# Patient Record
Sex: Female | Born: 1960
Health system: Southern US, Community
[De-identification: ages and names within clinical notes are randomized; demographics above are authoritative.]

## PROBLEM LIST (undated history)

## (undated) DIAGNOSIS — E739 Lactose intolerance, unspecified: Secondary | ICD-10-CM

## (undated) DIAGNOSIS — M5430 Sciatica, unspecified side: Secondary | ICD-10-CM

## (undated) DIAGNOSIS — J45909 Unspecified asthma, uncomplicated: Secondary | ICD-10-CM

## (undated) DIAGNOSIS — B009 Herpesviral infection, unspecified: Secondary | ICD-10-CM

## (undated) DIAGNOSIS — M549 Dorsalgia, unspecified: Secondary | ICD-10-CM

## (undated) DIAGNOSIS — M2550 Pain in unspecified joint: Secondary | ICD-10-CM

## (undated) HISTORY — DX: Lactose intolerance, unspecified: E73.9

## (undated) HISTORY — DX: Herpesviral infection, unspecified: B00.9

## (undated) HISTORY — DX: Pain in unspecified joint: M25.50

## (undated) HISTORY — DX: Sciatica, unspecified side: M54.30

## (undated) HISTORY — DX: Dorsalgia, unspecified: M54.9

## (undated) HISTORY — DX: Unspecified asthma, uncomplicated: J45.909

---

## 1996-09-27 HISTORY — PX: MYOMECTOMY: SHX85

## 1997-12-03 HISTORY — PX: TOTAL ABDOMINAL HYSTERECTOMY W/ BILATERAL SALPINGOOPHORECTOMY: SHX83

## 1999-10-26 ENCOUNTER — Other Ambulatory Visit: Admission: RE | Admit: 1999-10-26 | Discharge: 1999-10-26 | Payer: Self-pay | Admitting: Obstetrics and Gynecology

## 2003-08-15 ENCOUNTER — Other Ambulatory Visit: Admission: RE | Admit: 2003-08-15 | Discharge: 2003-08-15 | Payer: Self-pay | Admitting: Obstetrics and Gynecology

## 2005-02-03 ENCOUNTER — Ambulatory Visit (HOSPITAL_COMMUNITY): Admission: RE | Admit: 2005-02-03 | Discharge: 2005-02-03 | Payer: Self-pay | Admitting: Obstetrics and Gynecology

## 2005-09-16 ENCOUNTER — Emergency Department (HOSPITAL_COMMUNITY): Admission: EM | Admit: 2005-09-16 | Discharge: 2005-09-16 | Payer: Self-pay | Admitting: Emergency Medicine

## 2006-04-01 ENCOUNTER — Ambulatory Visit (HOSPITAL_COMMUNITY): Admission: RE | Admit: 2006-04-01 | Discharge: 2006-04-01 | Payer: Self-pay | Admitting: Obstetrics & Gynecology

## 2007-05-22 ENCOUNTER — Ambulatory Visit (HOSPITAL_COMMUNITY): Admission: RE | Admit: 2007-05-22 | Discharge: 2007-05-22 | Payer: Self-pay | Admitting: Obstetrics & Gynecology

## 2007-05-24 ENCOUNTER — Encounter: Admission: RE | Admit: 2007-05-24 | Discharge: 2007-05-24 | Payer: Self-pay | Admitting: Obstetrics & Gynecology

## 2008-09-09 ENCOUNTER — Ambulatory Visit (HOSPITAL_COMMUNITY): Admission: RE | Admit: 2008-09-09 | Discharge: 2008-09-09 | Payer: Self-pay | Admitting: Internal Medicine

## 2008-09-30 ENCOUNTER — Encounter: Admission: RE | Admit: 2008-09-30 | Discharge: 2008-09-30 | Payer: Self-pay | Admitting: Internal Medicine

## 2008-10-16 ENCOUNTER — Encounter: Admission: RE | Admit: 2008-10-16 | Discharge: 2009-01-14 | Payer: Self-pay | Admitting: Anesthesiology

## 2008-12-02 ENCOUNTER — Encounter: Admission: RE | Admit: 2008-12-02 | Discharge: 2009-01-29 | Payer: Self-pay | Admitting: Internal Medicine

## 2009-06-20 ENCOUNTER — Ambulatory Visit (HOSPITAL_COMMUNITY): Admission: RE | Admit: 2009-06-20 | Discharge: 2009-06-20 | Payer: Self-pay | Admitting: Obstetrics & Gynecology

## 2010-07-30 ENCOUNTER — Ambulatory Visit (HOSPITAL_COMMUNITY): Admission: RE | Admit: 2010-07-30 | Discharge: 2010-07-30 | Payer: Self-pay | Admitting: Obstetrics & Gynecology

## 2010-10-18 ENCOUNTER — Encounter: Payer: Self-pay | Admitting: Obstetrics and Gynecology

## 2010-10-18 ENCOUNTER — Encounter: Payer: Self-pay | Admitting: Obstetrics & Gynecology

## 2011-07-05 ENCOUNTER — Emergency Department (HOSPITAL_COMMUNITY)
Admission: EM | Admit: 2011-07-05 | Discharge: 2011-07-05 | Disposition: A | Discharge status: Home / Self Care | Payer: PRIVATE HEALTH INSURANCE | Attending: Emergency Medicine | Admitting: Emergency Medicine

## 2011-07-05 DIAGNOSIS — Y990 Civilian activity done for income or pay: Secondary | ICD-10-CM | POA: Insufficient documentation

## 2011-07-05 DIAGNOSIS — W230XXA Caught, crushed, jammed, or pinched between moving objects, initial encounter: Secondary | ICD-10-CM | POA: Insufficient documentation

## 2011-07-05 DIAGNOSIS — M79609 Pain in unspecified limb: Secondary | ICD-10-CM | POA: Insufficient documentation

## 2011-07-05 DIAGNOSIS — S6000XA Contusion of unspecified finger without damage to nail, initial encounter: Secondary | ICD-10-CM | POA: Insufficient documentation

## 2011-07-07 ENCOUNTER — Ambulatory Visit: Payer: Self-pay

## 2011-07-07 ENCOUNTER — Other Ambulatory Visit: Payer: Self-pay | Admitting: Occupational Medicine

## 2011-07-07 DIAGNOSIS — R52 Pain, unspecified: Secondary | ICD-10-CM

## 2011-11-23 ENCOUNTER — Other Ambulatory Visit: Payer: Self-pay | Admitting: Nurse Practitioner

## 2011-11-23 DIAGNOSIS — Z1231 Encounter for screening mammogram for malignant neoplasm of breast: Secondary | ICD-10-CM

## 2011-12-16 ENCOUNTER — Ambulatory Visit (HOSPITAL_COMMUNITY): Payer: Self-pay

## 2012-07-24 ENCOUNTER — Other Ambulatory Visit: Payer: Self-pay | Admitting: Nurse Practitioner

## 2012-07-24 DIAGNOSIS — Z1231 Encounter for screening mammogram for malignant neoplasm of breast: Secondary | ICD-10-CM

## 2012-07-25 ENCOUNTER — Ambulatory Visit (HOSPITAL_COMMUNITY)
Admission: RE | Admit: 2012-07-25 | Discharge: 2012-07-25 | Disposition: A | Discharge status: Home / Self Care | Payer: 59 | Source: Ambulatory Visit | Attending: Nurse Practitioner | Admitting: Nurse Practitioner

## 2012-07-25 DIAGNOSIS — Z1231 Encounter for screening mammogram for malignant neoplasm of breast: Secondary | ICD-10-CM | POA: Insufficient documentation

## 2012-10-03 ENCOUNTER — Ambulatory Visit (INDEPENDENT_AMBULATORY_CARE_PROVIDER_SITE_OTHER): Payer: Self-pay | Admitting: Family Medicine

## 2012-10-03 DIAGNOSIS — Z713 Dietary counseling and surveillance: Secondary | ICD-10-CM

## 2012-10-14 ENCOUNTER — Emergency Department (HOSPITAL_COMMUNITY)
Admission: EM | Admit: 2012-10-14 | Discharge: 2012-10-14 | Disposition: A | Discharge status: Home / Self Care | Payer: 59 | Source: Home / Self Care

## 2012-10-14 ENCOUNTER — Encounter (HOSPITAL_COMMUNITY): Payer: Self-pay | Admitting: Emergency Medicine

## 2012-10-14 DIAGNOSIS — H6123 Impacted cerumen, bilateral: Secondary | ICD-10-CM

## 2012-10-14 DIAGNOSIS — H6120 Impacted cerumen, unspecified ear: Secondary | ICD-10-CM

## 2012-10-14 MED ORDER — HYDROCODONE-ACETAMINOPHEN 5-325 MG PO TABS: 1.0000 | ORAL_TABLET | Freq: Once | ORAL | Status: DC

## 2012-10-14 MED ORDER — ANTIPYRINE-BENZOCAINE 5.4-1.4 % OT SOLN: 3.0000 [drp] | Freq: Once | OTIC | Status: DC

## 2012-10-14 NOTE — ED Provider Notes (Signed)
History     CSN: [id number]  Arrival date & time 10/14/12  1124   None     Chief Complaint  Patient presents with  . Cerumen Impaction     Patient is a 52 y.o. female presenting with plugged ear sensation. The history is provided by the patient.  Ear Fullness This is a recurrent problem. The current episode started more than 1 week ago. The problem occurs constantly. The problem has been gradually worsening. Nothing aggravates the symptoms. Nothing relieves the symptoms.  Pt reports approx 2 week h/o increasing fullness and pressure in bil ears. Reports hearing is somewhat "muffeled". She has had "build up'' of ear wax in the past and feels this is source of sx's. She has attempted OTC remedies w/o relief. States now having som itching in bil ears which she attributes to use of OTC remedies. Denies fever, URI sx's, or drainage from either ear.   History reviewed. No pertinent past medical history.  Past Surgical History  Procedure Date  . Abdominal hysterectomy 1999    No family history on file.  History  Substance Use Topics  . Smoking status: Never Smoker   . Smokeless tobacco: Not on file  . Alcohol Use: Yes    OB History    Grav Para Term Preterm Abortions TAB SAB Ect Mult Living                  Review of Systems  Constitutional: Negative.   HENT: Positive for hearing loss. Negative for ear pain, congestion and ear discharge.   Eyes: Negative.   Respiratory: Negative.   Cardiovascular: Negative.   Gastrointestinal: Negative.   Genitourinary: Negative.   Musculoskeletal: Negative.   Neurological: Negative.   Hematological: Negative.   Psychiatric/Behavioral: Negative.   All other systems reviewed and are negative.    Allergies  Review of patient's allergies indicates no known allergies.  Home Medications  No current outpatient prescriptions on file.  BP 141/85  Pulse 51  Temp 97.9 F (36.6 C) (Oral)  Resp 18  SpO2 100%  Physical Exam    Constitutional: She is oriented to person, place, and time. She appears well-developed and well-nourished.  HENT:  Head: Normocephalic and atraumatic.  Right Ear: External ear normal. Decreased hearing is noted.  Left Ear: External ear normal. Decreased hearing is noted.  Nose: Nose normal.  Mouth/Throat: Uvula is midline, oropharynx is clear and moist and mucous membranes are normal.       Bil cerumen impactions  Eyes: Conjunctivae normal are normal.  Neck: Neck supple.  Cardiovascular: Normal rate.   Pulmonary/Chest: Effort normal.  Musculoskeletal: Normal range of motion.  Neurological: She is alert and oriented to person, place, and time.  Skin: Skin is warm and dry.  Psychiatric: She has a normal mood and affect.    ED Course  Procedures  Labs Reviewed - No data to display No results found.   1. Impacted cerumen of both ears       MDM  Sx's and PE c/w bil cerumen impactions. TM's normal after bil ear irrigations and successful removal of cerumen. Strategies for avoiding cerumen impactions in future discussed.        Leanne Chang, NP 10/16/12 0157

## 2012-10-14 NOTE — ED Notes (Signed)
Pt c/o bilateral ear irritation x3 weeks Sx include: itching Hx of Cerumen Impaction Denies: fevers, vomiting, nauseas, diarrhea, dizziness Has been using Debrox w/little relief  She is alert w/no signs of acute distress.

## 2012-10-16 NOTE — ED Provider Notes (Signed)
Medical screening examination/treatment/procedure(s) were performed by resident physician or non-physician practitioner and as supervising physician I was immediately available for consultation/collaboration.   KINDL,JAMES DOUGLAS MD.    James D Kindl, MD 10/16/12 1836 

## 2012-12-18 ENCOUNTER — Encounter: Payer: Self-pay | Admitting: Nurse Practitioner

## 2012-12-19 ENCOUNTER — Ambulatory Visit (INDEPENDENT_AMBULATORY_CARE_PROVIDER_SITE_OTHER): Payer: 59 | Admitting: Nurse Practitioner

## 2012-12-19 ENCOUNTER — Encounter: Payer: Self-pay | Admitting: Nurse Practitioner

## 2012-12-19 VITALS — BP 110/70 | Ht 66.0 in | Wt 262.0 lb

## 2012-12-19 DIAGNOSIS — K625 Hemorrhage of anus and rectum: Secondary | ICD-10-CM

## 2012-12-19 DIAGNOSIS — Z01419 Encounter for gynecological examination (general) (routine) without abnormal findings: Secondary | ICD-10-CM

## 2012-12-19 DIAGNOSIS — B009 Herpesviral infection, unspecified: Secondary | ICD-10-CM | POA: Insufficient documentation

## 2012-12-19 DIAGNOSIS — N3090 Cystitis, unspecified without hematuria: Secondary | ICD-10-CM

## 2012-12-19 LAB — HEMOGLOBIN, FINGERSTICK: Hemoglobin, fingerstick: 14.1 g/dL (ref 12.0–16.0)

## 2012-12-19 MED ORDER — VALACYCLOVIR HCL 1 G PO TABS: 1000.0000 mg | ORAL_TABLET | Freq: Two times a day (BID) | ORAL | Status: DC

## 2012-12-19 NOTE — Procedures (Signed)
Subjective:    Hannah Gibson is a 52 y.o. female here for evaluation of blood in stool. Patient has associated symptoms of constipation and visible blood: just notes blood on TP. The patient denies abdominal pain, alternating loose stools and constipation, change in stool color: pain with normal stools and diarrhea. The patient has a known history of: no known risk factors. The patient has had 3 episodes of rectal bleeding. There is not a history of rectal injury. Patient has not had similar episodes of rectal bleeding in the past.  The following portions of the patient's history were reviewed and updated as appropriate: allergies, current medications, past family history, past medical history, past social history, past surgical history and problem list.  Review of Systems Constitutional: negative Respiratory: negative Cardiovascular: negative Gastrointestinal: negative Genitourinary:positive for dysuria and frequency Hematologic/lymphatic: negative Behavioral/Psych: negative    Objective:    BP 110/70  Ht 5\' 6"  (1.676 m)  Wt 262 lb (118.842 kg)  BMI 42.31 kg/m2  LMP 12/09/1997  General Appearance:  Alert, cooperative, no distress, appears stated age  Head:  Normocephalic, without obvious abnormality, atraumatic  Eyes:  PERRL,            Neck: Supple, symmetrical, trachea midline, no adenopathy;  thyroid: not enlarged, symmetric, no tenderness/mass/nodules; no carotid bruit or JVD  Back:   Symmetric, no curvature, ROM normal, no CVA tenderness  Lungs:   Clear to auscultation bilaterally, respirations unlabored  Breasts:  No masses or tenderness  Heart:  Regular rate and rhythm, S1 and S2 normal, no murmur, rub, or gallop  Abdomen:   Soft, non-tender, bowel sounds active all four quadrants,  no masses, no organomegaly  Pelvic: As  Noted on AEX  Extremities: Extremities normal, atraumatic, no cyanosis or edema  Pulses: 2+ and symmetric  Skin: Skin color, texture, turgor  normal, no rashes or lesions  Lymph nodes: Cervical, supraclavicular, and axillary nodes normal  Neurologic: Normal    Rectal: No external mass or hemorrhoids, no sign of bleeding.  Digital exam with internal hemorrhoid at 7 '00 clock position.  On visual exam with anal scope pt. had a small 4 mm rectal internal hemorrhoid without bleeding. No other masses found.  Patient tolerated the procedure well.   Assessment:    Rectal bleeding, likely secondary to internal hemorrhoids.    Plan:    1. If continues to call back and will get GI evaluation  Follow up as needed.  She is given After visit summary about hemorrhoids

## 2012-12-19 NOTE — Progress Notes (Signed)
52 y.o. Single African American female   G2P0020 here for annual exam.    Currently no vaso symptoms.  She is not currently sexually active and has no vaginal symptoms. Last week had urinary pressure, no dysuria, and some frequency.  Has tried to increase po fluids and currently feels better.  About 3 wks ago had BRRB with straining to have a BM.  Blood was on the stool but not in it.  She was straining a lot during this episode.  Since then has noted this 2 other times with blood on the tissue. No associated abdominal pain, weakness or fatigue.  Normal BM's since without bleeding.  No past history of colon disease.  No change in appetite, abdominal cramps.  Symptoms only present when straining. Not sexually active.  Patient's last menstrual period was 12/09/1997.          Sexually active: no  The current method of family planning is post hysterectomy.    Exercising: no  no exercise Last mammogram:06/2012 Last pap: 2001 Last BMD: none Alcohol: no Tobacco: no   No health maintenance topics applied.  Family History  Problem Relation Age of Onset  . Diabetes Mother   . Hypertension Mother   . Kidney disease Mother   . Diabetes Maternal Aunt     There is no problem list on file for this patient.   Past Medical History  Diagnosis Date  . HSV infection     Past Surgical History  Procedure Laterality Date  . Myomectomy  1998  . Total abdominal hysterectomy w/ bilateral salpingoophorectomy  12/03/1997    secondary to fibroids Patient states she has one ovary 12/19/12 BMP, CMA    Allergies: Review of patient's allergies indicates no known allergies.  Current Outpatient Prescriptions  Medication Sig Dispense Refill  . Multiple Vitamin (MULTIVITAMIN WITH MINERALS) TABS Take 1 tablet by mouth daily.      . naproxen sodium (ANAPROX) 220 MG tablet Take 220 mg by mouth 2 (two) times daily with a meal.      . valACYclovir (VALTREX) 1000 MG tablet Take 1,000 mg by mouth 2 (two) times  daily.       No current facility-administered medications for this visit.    ROS: Pertinent items are noted in HPI.  Exam:    BP 110/70  Ht 5\' 6"  (1.676 m)  Wt 262 lb (118.842 kg)  BMI 42.31 kg/m2  LMP 12/09/1997 Weight change: @WEIGHTCHANGE @ Last 3 height recordings:  Ht Readings from Last 3 Encounters:  12/19/12 5\' 6"  (1.676 m)   General appearance: alert, cooperative, no distress and moderately obese Head: Normocephalic, without obvious abnormality, atraumatic Neck: no adenopathy, no carotid bruit, no JVD, supple, symmetrical, trachea midline and thyroid not enlarged, symmetric, no tenderness/mass/nodules Lungs: clear to auscultation bilaterally Breasts: normal appearance, no masses or tenderness Heart: regular rate and rhythm, S1, S2 normal, no murmur, click, rub or gallop Abdomen: soft, non-tender; bowel sounds normal; no masses,  no organomegaly Extremities: extremities normal, atraumatic, no cyanosis or edema Skin: Skin color, texture, turgor normal. No rashes or lesions Lymph nodes: Cervical, supraclavicular, and axillary nodes normal. no inguinal nodes palpated Neurologic: Grossly normal   Pelvic: External genitalia:  no lesions              Urethra: not indicated and normal appearing urethra with no masses, tenderness or lesions              Bartholins and Skenes: normal  Vagina: normal appearing vagina with normal color and discharge, no lesions              Cervix: absent              Pap taken: no        Bimanual Exam:  Uterus:  uterus is surgically absent, vaginal cuff well healed                                      Adnexa:    normal adnexa in size, nontender and no masses                                      Rectovaginal: no external hemorrhoids or evidence of bleeding                                      Anus:  As noted in procedure note, there is an internal rectal hemorrhoid at 7:00 position that is not bleeding   Urine chemstrip trace  protein, culture and micro sent to lab  Hgb : 14.1  A:   GYN well woman exam  R/O UTI  BRRB - from internal hemorrhoid as noted on proctoscope    Plan: Plan:     1. Medications: not indicated at this time  2. Maintain adequate hydration  3. Follow up if symptoms not improving, and prn if further rectal bleeding  4.  Mammogram due 06/2012  5. Return annually or prn      An After Visit Summary was printed and given to the patient.

## 2012-12-19 NOTE — Patient Instructions (Addendum)
If any further rectal bleeding call us back.   Rectal Bleeding Rectal bleeding is when blood passes out of the anus. It is usually a sign that something is wrong. It may not be serious, but it should always be evaluated. Rectal bleeding may present as bright red blood or extremely dark stools. The color may range from dark red or maroon to black (like tar). It is important that the cause of rectal bleeding be identified so treatment can be started and the problem corrected. CAUSES   Hemorrhoids. These are enlarged (dilated) blood vessels or veins in the anal or rectal area.  Fistulas. Theseare abnormal, burrowing channels that usually run from inside the rectum to the skin around the anus. They can bleed.  Anal fissures. This is a tear in the tissue of the anus. Bleeding occurs with bowel movements.  Diverticulosis. This is a condition in which pockets or sacs project from the bowel wall. Occasionally, the sacs can bleed.  Diverticulitis. Thisis an infection involving diverticulosis of the colon.  Proctitis and colitis. These are conditions in which the rectum, colon, or both, can become inflamed and pitted (ulcerated).  Polyps and cancer. Polyps are non-cancerous (benign) growths in the colon that may bleed. Certain types of polyps turn into cancer.  Protrusion of the rectum. Part of the rectum can project from the anus and bleed.  Certain medicines.  Intestinal infections.  Blood vessel abnormalities. HOME CARE INSTRUCTIONS  Eat a high-fiber diet to keep your stool soft.  Limit activity.  Drink enough fluids to keep your urine clear or pale yellow.  Warm baths may be useful to soothe rectal pain.  Follow up with your caregiver as directed. SEEK IMMEDIATE MEDICAL CARE IF:  You develop increased bleeding.  You have black or dark red stools.  You vomit blood or material that looks like coffee grounds.  You have abdominal pain or tenderness.  You have a fever.  You  feel weak, nauseous, or you faint.  You have severe rectal pain or you are unable to have a bowel movement. MAKE SURE YOU:  Understand these instructions.  Will watch your condition.  Will get help right away if you are not doing well or get worse. Document Released: 03/05/2002 Document Revised: 12/06/2011 Document Reviewed: 02/28/2011 [facility] Patient Information 2013 [place], [place].

## 2012-12-20 LAB — URINALYSIS, MICROSCOPIC ONLY
Bacteria, UA: NONE SEEN
Casts: NONE SEEN
Crystals: NONE SEEN

## 2012-12-21 LAB — CULTURE, URINE COMPREHENSIVE
Colony Count: NO GROWTH
Organism ID, Bacteria: NO GROWTH

## 2012-12-22 LAB — POCT URINALYSIS DIPSTICK
Bilirubin, UA: NEGATIVE
Blood, UA: NEGATIVE
Glucose, UA: NEGATIVE
Ketones, UA: NEGATIVE
Leukocytes, UA: NEGATIVE
Nitrite, UA: NEGATIVE

## 2012-12-22 NOTE — Progress Notes (Signed)
Encounter reviewed by Dr. Brook Silva.  

## 2013-01-23 ENCOUNTER — Telehealth: Payer: Self-pay | Admitting: Nurse Practitioner

## 2013-01-23 NOTE — Telephone Encounter (Signed)
Opened in error

## 2013-02-05 ENCOUNTER — Ambulatory Visit (INDEPENDENT_AMBULATORY_CARE_PROVIDER_SITE_OTHER): Payer: 59 | Admitting: Family Medicine

## 2013-02-05 VITALS — BP 132/82 | HR 76 | Temp 98.2°F | Resp 16 | Ht 67.0 in | Wt 264.4 lb

## 2013-02-05 DIAGNOSIS — H9209 Otalgia, unspecified ear: Secondary | ICD-10-CM

## 2013-02-05 DIAGNOSIS — H9202 Otalgia, left ear: Secondary | ICD-10-CM

## 2013-02-05 DIAGNOSIS — H60392 Other infective otitis externa, left ear: Secondary | ICD-10-CM

## 2013-02-05 DIAGNOSIS — H60399 Other infective otitis externa, unspecified ear: Secondary | ICD-10-CM

## 2013-02-05 MED ORDER — OFLOXACIN 0.3 % OT SOLN: 10.0000 [drp] | Freq: Every day | OTIC | Status: DC

## 2013-02-05 MED ORDER — AMOXICILLIN 875 MG PO TABS: 875.0000 mg | ORAL_TABLET | Freq: Two times a day (BID) | ORAL | Status: DC

## 2013-02-05 NOTE — Progress Notes (Signed)
[facility] [address] [place] [address] [phone]  Date:  02/05/2013   Name:  Hannah Gibson   DOB:  [birth year]   MRN:  [id number]  PCP:  Gwen Pounds, MD    Chief Complaint: Otalgia   History of Present Illness:  Hannah Gibson is a 52 y.o. very pleasant female patient who presents with the following:  She is here with a left ear problem.  The area feels sore, and her hearing is decreased.  She has noted this problem for the last 4 or 5 days.  She has not had any external drainage.  Actually her ears have bothered her some over the last few years.  She feels that her ears get clogged very easily and she has to irrigate them about once a week.   She has not been swimming She has not noted a cough, sore throat or fever.    She is otherwise generally healthy.  History of HSV/ valtrex but no other chronic medications.   NKDA, never a smoker.    Patient Active Problem List   Diagnosis Date Noted  . HSV infection 12/19/2012  . Rectal bleeding 12/19/2012    Past Medical History  Diagnosis Date  . HSV infection     Past Surgical History  Procedure Laterality Date  . Myomectomy  1998  . Total abdominal hysterectomy w/ bilateral salpingoophorectomy  12/03/1997    secondary to fibroids    History  Substance Use Topics  . Smoking status: Never Smoker   . Smokeless tobacco: Never Used  . Alcohol Use: 2.5 oz/week    5 drink(s) per week    Family History  Problem Relation Age of Onset  . Diabetes Mother   . Hypertension Mother   . Kidney disease Mother   . Glaucoma Mother   . Diabetes Maternal Aunt   . Glaucoma Sister     No Known Allergies  Medication list has been reviewed and updated.  Current Outpatient Prescriptions on File Prior to Visit  Medication Sig Dispense Refill  . Multiple Vitamin (MULTIVITAMIN WITH MINERALS) TABS Take 1 tablet by mouth daily.      . naproxen sodium (ANAPROX) 220 MG tablet Take 220 mg by mouth 2  (two) times daily with a meal.      . valACYclovir (VALTREX) 1000 MG tablet Take 1 tablet (1,000 mg total) by mouth 2 (two) times daily.  30 tablet  12   No current facility-administered medications on file prior to visit.    Review of Systems:  As per HPI- otherwise negative.   Physical Examination: Filed Vitals:   02/05/13 0844  BP: 158/80  Pulse: 76  Temp: 98.2 F (36.8 C)  Resp: 16   Filed Vitals:   02/05/13 0844  Height: 5\' 7"  (1.702 m)  Weight: 264 lb 6.4 oz (119.931 kg)   Body mass index is 41.4 kg/(m^2). Ideal Body Weight: Weight in (lb) to have BMI = 25: 159.3  GEN: WDWN, NAD, Non-toxic, A & O x 3, obese HEENT: Atraumatic, Normocephalic. Neck supple. No masses, No LAD. Oropharynx normal.  PEERL,EOMI.   She has narrow ear canals bilaterally. The right TM is normal The left ear canal is very narrow, and appears slightly edematous and moist.  Not able to fully visualize the TM.  She is tender with manipulation of the pinna.   Ears and Nose: No external deformity. CV: RRR, No M/G/R. No JVD. No thrill. No extra heart sounds.  PULM: CTA B, no wheezes, crackles, rhonchi. No retractions. No resp. distress. No accessory muscle use. EXTR: No c/c/e NEURO Normal gait.  PSYCH: Normally interactive. Conversant. Not depressed or anxious appearing.  Calm demeanor.    Assessment and Plan: Otitis, externa, infective, left - Plan: ofloxacin (FLOXIN) 0.3 % otic solution, amoxicillin (AMOXIL) 875 MG tablet  Ear pain, left - Plan: Ambulatory referral to ENT  Suspect she has some OE- may be due to her very narrow ear canals and chronic water retention.  Will treat with floxin otic and amoxicillin.  Referral to ENT for more chronic management.  Recheck in one week- Sooner if worse.     Signed Abbe Amsterdam, MD

## 2013-02-05 NOTE — Patient Instructions (Addendum)
Use the ear drops- either 10 drops daily or 5 drops twice a day.  Use the amoxicillin as directed.   We will arrange for you to see and ENT doctor for chronic follow-up of your ear problem.  Please see either your PCP, our clinic or your ENT in about one week for a recheck of your ear- sooner if you do not feel better

## 2013-09-13 ENCOUNTER — Telehealth: Payer: Self-pay

## 2013-09-13 NOTE — Telephone Encounter (Signed)
Last AEX 11-2012 with Patty. LMTCB

## 2013-09-13 NOTE — Telephone Encounter (Signed)
Pt has a yeast infection and can not get away from work to come in . Pt wants to have something called into the pharmacy if possible.

## 2013-09-13 NOTE — Telephone Encounter (Signed)
Patient states she has external yeast infecction and also between skin folds on abdomen and requesting medication be called in because she works till [employer]. Advised she would need OV for treatment and offered urgent care if prefers their extended schedule hours. Patient states she had not thought of that but will go there.   Routing to provider for final review. Patient agreeable to disposition. Will close encounter

## 2014-04-13 ENCOUNTER — Ambulatory Visit (INDEPENDENT_AMBULATORY_CARE_PROVIDER_SITE_OTHER): Payer: 59 | Admitting: Emergency Medicine

## 2014-04-13 VITALS — BP 126/84 | HR 74 | Temp 98.2°F | Resp 16 | Ht 66.5 in | Wt 265.0 lb

## 2014-04-13 DIAGNOSIS — H10219 Acute toxic conjunctivitis, unspecified eye: Secondary | ICD-10-CM

## 2014-04-13 DIAGNOSIS — H10213 Acute toxic conjunctivitis, bilateral: Secondary | ICD-10-CM

## 2014-04-13 NOTE — Progress Notes (Signed)
[facility] [address] [place] [address] [phone]  Date:  04/13/2014   Name:  Hannah Gibson   DOB:  Nov 06, 1960   MRN:  [id number]  PCP:  Precious Reel, MD    Chief Complaint: Eye Problem   History of Present Illness:  Hannah Gibson is a 53 y.o. very pleasant female patient who presents with the following:  Patient got some facial chemical compound in her eyes.  Has marked blepharospasm and burning.  No foreign body sensation.  Burning pain in eyes.  Denies other complaint or health concern today. Denies other complaint or health concern today.   Patient Active Problem List   Diagnosis Date Noted  . HSV infection 12/19/2012  . Rectal bleeding 12/19/2012    Past Medical History  Diagnosis Date  . HSV infection     Past Surgical History  Procedure Laterality Date  . Myomectomy  1998  . Total abdominal hysterectomy w/ bilateral salpingoophorectomy  12/03/1997    secondary to fibroids    History  Substance Use Topics  . Smoking status: Never Smoker   . Smokeless tobacco: Never Used  . Alcohol Use: 2.5 oz/week    5 drink(s) per week    Family History  Problem Relation Age of Onset  . Diabetes Mother   . Hypertension Mother   . Kidney disease Mother   . Glaucoma Mother   . Diabetes Maternal Aunt   . Glaucoma Sister     No Known Allergies  Medication list has been reviewed and updated.  Current Outpatient Prescriptions on File Prior to Visit  Medication Sig Dispense Refill  . amoxicillin (AMOXIL) 875 MG tablet Take 1 tablet (875 mg total) by mouth 2 (two) times daily.  20 tablet  0  . Multiple Vitamin (MULTIVITAMIN WITH MINERALS) TABS Take 1 tablet by mouth daily.      . naproxen sodium (ANAPROX) 220 MG tablet Take 220 mg by mouth 2 (two) times daily with a meal.      . ofloxacin (FLOXIN) 0.3 % otic solution Place 10 drops into the left ear daily.  5 mL  0  . valACYclovir (VALTREX) 1000 MG tablet Take 1 tablet (1,000 mg  total) by mouth 2 (two) times daily.  30 tablet  12   No current facility-administered medications on file prior to visit.    Review of Systems:  As per HPI, otherwise negative.     Physical Examination: Filed Vitals:   04/13/14 0858  BP: 126/84  Pulse: 74  Temp: 98.2 F (36.8 C)  Resp: 16   Filed Vitals:   04/13/14 0858  Height: 5' 6.5" (1.689 m)  Weight: 265 lb (120.203 kg)   Body mass index is 42.14 kg/(m^2). Ideal Body Weight: Weight in (lb) to have BMI = 25: 156.9   GEN: WDWN, moderate distress.  No facial burns, Non-toxic, Alert & Oriented x 3 HEENT: Atraumatic, Normocephalic.  Ears and Nose: No external deformity.  Marked blepharmospasm and conjunctival injection EXTR: No clubbing/cyanosis/edema NEURO: Normal gait.  PSYCH: Normally interactive. Conversant. Not depressed or anxious appearing.  Calm demeanor.    Assessment and Plan: Washed 1 liter each with NSS Resolved pain and opens eyes on command.   No complaints.  Signed,  Ellison Carwin, MD

## 2014-04-13 NOTE — Patient Instructions (Signed)
Chemical Conjunctivitis Chemical conjunctivitis is an irritation of the underside of the eyelid and the white part of the eye. Conjunctivitis can be caused by infection, allergy or chemical irritation. In your case it has been caused by a chemical irritation of the eye. Symptoms almost always include: tearing, light sensitivity, gritty feeling (sensation) in the eyes, swelling of your eyelids, and often severe pain. In spite of the severe pain, this irritation will run its course and will improve within 24 hours.  HOME CARE INSTRUCTIONS   To ease discomfort apply a cool, clean wash cloth to your eye for 10 to 20 minutes, 3 to 4 times per day.  Do not rub your eyes.  Gently wipe away any discharge from the eyes with moistened tissues.  Wash your hands often with soap and use paper towels to dry.  Sunglasses may be helpful if light bothers your eyes.  Do not use eye make-up.  Do not use contact lenses until the irritation is gone.  Do not operate machinery or drive if your vision is blurred.  Take medications as directed by your caregiver. Artificial tears may ease discomfort.  Avoid the chemical or surroundings which caused the problem. Always use eye protection as necessary. SEEK MEDICAL CARE IF:   The eye is still pink (inflamed) 3 days after beginning treatment.  Pain in the eye increases.  You have discharge coming from either eye.  Your eyelids are stuck together in the morning.  You have an increased sensitivity to light.  An oral temperature above 102 F (38.9 C) develops.  You develop facial pain.  You have any problems that may be related to the medicine you are taking. SEEK IMMEDIATE MEDICAL CARE IF:   Your vision is getting worse.  You develop severe eye pain. MAKE SURE YOU:   Understand these instructions.  Will watch your condition.  Will get help right away if you are not doing well or get worse. Document Released: 06/23/2005 Document Revised:  12/06/2011 Document Reviewed: 05/01/2008 [employer] Patient Information 2015 [employer]. This information is not intended to replace advice given to you by your health care provider. Make sure you discuss any questions you have with your health care provider.  

## 2014-04-25 ENCOUNTER — Other Ambulatory Visit (HOSPITAL_COMMUNITY): Payer: Self-pay | Admitting: Family Medicine

## 2014-04-25 DIAGNOSIS — Z1231 Encounter for screening mammogram for malignant neoplasm of breast: Secondary | ICD-10-CM

## 2014-05-14 ENCOUNTER — Ambulatory Visit (HOSPITAL_COMMUNITY)
Admission: RE | Admit: 2014-05-14 | Discharge: 2014-05-14 | Disposition: A | Discharge status: Home / Self Care | Payer: 59 | Source: Ambulatory Visit | Attending: Family Medicine | Admitting: Family Medicine

## 2014-05-14 DIAGNOSIS — Z1231 Encounter for screening mammogram for malignant neoplasm of breast: Secondary | ICD-10-CM | POA: Insufficient documentation

## 2015-05-24 ENCOUNTER — Other Ambulatory Visit: Payer: Self-pay | Admitting: Nurse Practitioner

## 2015-05-26 NOTE — Telephone Encounter (Signed)
Pt not seen in 2 years - needs AEX scheduled before we can refill.

## 2015-05-26 NOTE — Telephone Encounter (Signed)
Patient was given a call and message was left stating that she needs an AEX before refilling her medication.

## 2015-05-26 NOTE — Telephone Encounter (Signed)
Medication refill request: Valtrex Last AEX:  12/19/12 with PG Next AEX: not scheduled Last MMG (if hormonal medication request):  Refill authorized: please advise.

## 2015-05-29 ENCOUNTER — Other Ambulatory Visit: Payer: Self-pay | Admitting: *Deleted

## 2015-05-29 ENCOUNTER — Other Ambulatory Visit: Payer: Self-pay | Admitting: Nurse Practitioner

## 2015-05-29 DIAGNOSIS — Z1231 Encounter for screening mammogram for malignant neoplasm of breast: Secondary | ICD-10-CM

## 2015-05-29 DIAGNOSIS — B009 Herpesviral infection, unspecified: Secondary | ICD-10-CM

## 2015-05-29 MED ORDER — VALACYCLOVIR HCL 1 G PO TABS: 1000.0000 mg | ORAL_TABLET | Freq: Two times a day (BID) | ORAL | Status: DC

## 2015-05-29 NOTE — Telephone Encounter (Signed)
AEX scheduled for 07/23/15 with PG

## 2015-05-29 NOTE — Telephone Encounter (Signed)
Faxed Medication refill request from [facility]: Valacyclovir 1 gram  Last AEX:  12/19/2012 with PG  Next AEX: 07/23/2015 PG  Last MMG (if hormonal medication request): n/a Refill authorized: #30/1 rfs  (routed Ms. Debbie since Ms. Chong Sicilian is out of the office today)

## 2015-05-29 NOTE — Telephone Encounter (Signed)
Needs aex will refill once only

## 2015-06-04 ENCOUNTER — Ambulatory Visit (HOSPITAL_COMMUNITY)
Admission: RE | Admit: 2015-06-04 | Discharge: 2015-06-04 | Disposition: A | Discharge status: Home / Self Care | Payer: 59 | Source: Ambulatory Visit | Attending: Nurse Practitioner | Admitting: Nurse Practitioner

## 2015-06-04 DIAGNOSIS — Z1231 Encounter for screening mammogram for malignant neoplasm of breast: Secondary | ICD-10-CM | POA: Insufficient documentation

## 2015-06-05 NOTE — Telephone Encounter (Signed)
Patient is scheduled for 07/23/15 with Edman Circle, FNP and requests a refill to last until then.

## 2015-07-23 ENCOUNTER — Ambulatory Visit (INDEPENDENT_AMBULATORY_CARE_PROVIDER_SITE_OTHER): Payer: 59 | Admitting: Nurse Practitioner

## 2015-07-23 ENCOUNTER — Encounter: Payer: Self-pay | Admitting: Nurse Practitioner

## 2015-07-23 VITALS — BP 120/84 | HR 54 | Ht 66.0 in | Wt 275.0 lb

## 2015-07-23 DIAGNOSIS — B009 Herpesviral infection, unspecified: Secondary | ICD-10-CM

## 2015-07-23 DIAGNOSIS — Z0000 Encounter for general adult medical examination without abnormal findings: Secondary | ICD-10-CM | POA: Diagnosis not present

## 2015-07-23 DIAGNOSIS — Z01419 Encounter for gynecological examination (general) (routine) without abnormal findings: Secondary | ICD-10-CM

## 2015-07-23 DIAGNOSIS — N62 Hypertrophy of breast: Secondary | ICD-10-CM | POA: Diagnosis not present

## 2015-07-23 MED ORDER — VALACYCLOVIR HCL 1 G PO TABS: 1000.0000 mg | ORAL_TABLET | Freq: Two times a day (BID) | ORAL | Status: DC

## 2015-07-23 NOTE — Progress Notes (Signed)
Patient ID: Hannah Gibson, female   DOB: 1961-05-31, 54 y.o.   MRN: [id number] 53 y.o. [id number] Single  African American Fe here for annual exam.  No new health changes. Not SA for a since 1990. No vaginal symptoms.  She is shaving more upper back and shoulder pain by the end of the day due to breast size.  She can find a bra that is large enough but not enough support.  Sometimes she will get rash under the breast due to heat rash or yeast.  Patient's last menstrual period was 12/09/1997 (exact date).          Sexually active: No.  The current method of family planning is abstinence.    Exercising: No.  The patient does not participate in regular exercise at present. Smoker:  no  Health Maintenance: Pap:  2001, negative  MMG:  06/04/15, Bi-Rads 1:  Negative Colonoscopy:  2014, polyp x 1, return in 5 years TDaP:  UTD with employer Labs: Dr. Laurann Montana, will be seen today   reports that she has never smoked. She has never used smokeless tobacco. She reports that she drinks about 2.5 oz of alcohol per week. She reports that she does not use illicit drugs.  Past Medical History  Diagnosis Date  . HSV infection     Past Surgical History  Procedure Laterality Date  . Myomectomy  1998  . Total abdominal hysterectomy w/ bilateral salpingoophorectomy  12/03/1997    secondary to fibroids    Current Outpatient Prescriptions  Medication Sig Dispense Refill  . Multiple Vitamin (MULTIVITAMIN WITH MINERALS) TABS Take 1 tablet by mouth daily.    . naproxen sodium (ANAPROX) 220 MG tablet Take 220 mg by mouth 2 (two) times daily with a meal.    . ofloxacin (FLOXIN) 0.3 % otic solution Place 10 drops into the left ear daily. 5 mL 0  . valACYclovir (VALTREX) 1000 MG tablet Take 1 tablet (1,000 mg total) by mouth 2 (two) times daily. 90 tablet 4   No current facility-administered medications for this visit.    Family History  Problem Relation Age of Onset  . Diabetes Mother   . Hypertension Mother    . Kidney disease Mother   . Glaucoma Mother   . Diabetes Maternal Aunt   . Glaucoma Sister     ROS:  Pertinent items are noted in HPI.  Otherwise, a comprehensive ROS was negative.  Exam:   BP 120/84 mmHg  Pulse 54  Ht 5\' 6"  (1.676 m)  Wt 275 lb (124.739 kg)  BMI 44.41 kg/m2  LMP 12/09/1997 (Exact Date) Height: 5\' 6"  (167.6 cm) Ht Readings from Last 3 Encounters:  07/23/15 5\' 6"  (1.676 m)  04/13/14 5' 6.5" (1.689 m)  02/05/13 5\' 7"  (1.702 m)    General appearance: alert, cooperative and appears stated age Head: Normocephalic, without obvious abnormality, atraumatic Neck: no adenopathy, supple, symmetrical, trachea midline and thyroid normal to inspection and palpation Lungs: clear to auscultation bilaterally Breasts: normal appearance, no masses or tenderness, bilateral macromastia.  She also has areas top of both shoulders from bra wear.  Heart: regular rate and rhythm Abdomen: soft, non-tender; no masses,  no organomegaly Extremities: extremities normal, atraumatic, no cyanosis or edema Skin: Skin color, texture, turgor normal. No rashes or lesions Lymph nodes: Cervical, supraclavicular, and axillary nodes normal. No abnormal inguinal nodes palpated Neurologic: Grossly normal   Pelvic: External genitalia:  no lesions  Urethra:  normal appearing urethra with no masses, tenderness or lesions              Bartholin's and Skene's: normal                 Vagina: normal appearing vagina with normal color and discharge, no lesions              Cervix: absent              Pap taken: No. Bimanual Exam:  Uterus:  uterus absent              Adnexa: no mass, fullness, tenderness               Rectovaginal: Confirms               Anus:  normal sphincter tone, no lesions  Chaperone present: no  A:  Well Woman with normal exam  S/P TAH/BSO secondary to fibroids 12/03/1997  History of HSV  History of macromastia   P:   Reviewed health and wellness pertinent to  exam  Pap smear as above  Mammogram is due 05/2016  Will get a consult visit with plastic surgery to discuss options  Refill Valtrex for a year  Counseled on breast self exam, mammography screening, adequate intake of calcium and vitamin D, diet and exercise return annually or prn  An After Visit Summary was printed and given to the patient.

## 2015-07-23 NOTE — Patient Instructions (Signed)

## 2015-07-27 NOTE — Progress Notes (Signed)
Encounter reviewed by Dr. Brook Amundson C. Silva.  

## 2016-03-11 DIAGNOSIS — Z83511 Family history of glaucoma: Secondary | ICD-10-CM | POA: Diagnosis not present

## 2016-03-11 DIAGNOSIS — H40053 Ocular hypertension, bilateral: Secondary | ICD-10-CM | POA: Diagnosis not present

## 2016-03-11 DIAGNOSIS — H40013 Open angle with borderline findings, low risk, bilateral: Secondary | ICD-10-CM | POA: Diagnosis not present

## 2016-05-10 MED FILL — predniSONE 5 MG TABS: 5 | 25 days supply | Qty: 100 | Fill #0

## 2016-05-21 DIAGNOSIS — M94261 Chondromalacia, right knee: Secondary | ICD-10-CM | POA: Diagnosis not present

## 2016-05-21 DIAGNOSIS — M25561 Pain in right knee: Secondary | ICD-10-CM | POA: Diagnosis not present

## 2016-07-06 ENCOUNTER — Other Ambulatory Visit: Payer: Self-pay | Admitting: Nurse Practitioner

## 2016-07-06 DIAGNOSIS — Z1231 Encounter for screening mammogram for malignant neoplasm of breast: Secondary | ICD-10-CM

## 2016-07-12 MED FILL — METHOCARBAMOL 500 MG TABLET: 500 | 20 days supply | Qty: 60 | Fill #0

## 2016-07-20 ENCOUNTER — Ambulatory Visit
Admission: RE | Admit: 2016-07-20 | Discharge: 2016-07-20 | Disposition: A | Discharge status: Home / Self Care | Payer: 59 | Source: Ambulatory Visit | Attending: Nurse Practitioner | Admitting: Nurse Practitioner

## 2016-07-20 DIAGNOSIS — Z1231 Encounter for screening mammogram for malignant neoplasm of breast: Secondary | ICD-10-CM | POA: Diagnosis not present

## 2016-07-26 ENCOUNTER — Ambulatory Visit: Payer: 59 | Admitting: Nurse Practitioner

## 2016-08-24 ENCOUNTER — Telehealth: Payer: Self-pay | Admitting: Nurse Practitioner

## 2016-08-24 NOTE — Telephone Encounter (Signed)
Left message to call Sharee Pimple at [phone] to schedule OV.

## 2016-08-24 NOTE — Telephone Encounter (Signed)
Spoke with patient. Patient states she has a painful hardened area with blister on left labia. Patient states she thought initially was a HSV outbreak and started valtrex 11/24. Patient states it has not gotten better or worse. Patient denies andy vaginal discharge, fever or urinary complaints. Recommended OV for further evaluation, offered appt for today with Kem Boroughs, NP -patient declined due to work schedule. Patient states she will need a Thursday or Friday appt. Advised pateint to be evaluated earlier than Thurs/Fri -patient declined. Advised patient Kem Boroughs, NP out of the office on Thursday, can schedule with covering provider, patient declined. Patient states she would like to see Kem Boroughs, NP. Patient scheduled for 08/27/16 at 10:15 am. Advised patient should symptoms change or worsen return call for scheduling earlier appt. Patient verbalizes understanding and is agreeable to date and time.  Routing to provider for final review. Patient is agreeable to disposition. Will close encounter.

## 2016-08-24 NOTE — Telephone Encounter (Signed)
Patient called states she thinks she may have an infection on her vagina.  Says she is taking Valtrex but thinks it is something more. Says she has knots on her vagina that are painful and very hard.  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

## 2016-08-26 DIAGNOSIS — Z83511 Family history of glaucoma: Secondary | ICD-10-CM | POA: Diagnosis not present

## 2016-08-27 ENCOUNTER — Ambulatory Visit (INDEPENDENT_AMBULATORY_CARE_PROVIDER_SITE_OTHER): Payer: 59 | Admitting: Nurse Practitioner

## 2016-08-27 ENCOUNTER — Encounter: Payer: Self-pay | Admitting: Nurse Practitioner

## 2016-08-27 VITALS — BP 126/84 | HR 68 | Temp 97.8°F | Ht 66.0 in | Wt 279.0 lb

## 2016-08-27 DIAGNOSIS — B009 Herpesviral infection, unspecified: Secondary | ICD-10-CM

## 2016-08-27 DIAGNOSIS — N898 Other specified noninflammatory disorders of vagina: Secondary | ICD-10-CM | POA: Diagnosis not present

## 2016-08-27 MED ORDER — CEPHALEXIN 500 MG PO CAPS: ORAL_CAPSULE | ORAL | 0 refills | Status: DC

## 2016-08-27 MED ORDER — VALACYCLOVIR HCL 1 G PO TABS: 1000.0000 mg | ORAL_TABLET | Freq: Two times a day (BID) | ORAL | 4 refills | Status: DC

## 2016-08-27 NOTE — Patient Instructions (Addendum)
Place epidermal inclusion cyst patient instructions here. Warm compressed as needed.  Antibiotics as directed.

## 2016-08-27 NOTE — Progress Notes (Signed)
55 y.o. Single African American female [id number] here with complaint of vaginal symptoms of vulvar lesion that started last Monday.  Area was quite sore then on Tuesday it popped with a lot of blood discharge.  since then has gotten smaller and feels more comfortable.  She did take extra doses of Valtrex initially thinking this may be an outbreak - but realized this was not HSV.    Onset of symptoms 5 days ago. Denies new personal products or vaginal dryness.   No STD concerns, not SA. Urinary symptoms none . Contraception is TAH?BSO secondary to fibroids.   Denies vaginal symptoms.   O:  Healthy female WDWN Affect: normal, orientation x 3  Exam:  No distress Abdomen:  Soft and non tender Lymph node: no enlargement or tenderness Pelvic exam: External genital: normal female with a cyst at the end of left labia - small amount of serous exudate remains and wound culture is obtained. BUS: negative Vagina: no discharge noted.     A: Inclusion cyst - resolving   P: Discussed findings of vaginitis and etiology. Discussed Aveeno or baking soda sitz bath for comfort. Avoid moist clothes or pads for extended period of time.   Olive Oil/Coconut Oil use for skin protection prior to activity can be used to external skin.  Rx: Keflex 500 mg BID for next 3-5 days  Warm compresses or sitz bath prn.  Follow with wound culture  She is given a refill on Valtrex as well.   RV prn

## 2016-08-31 LAB — WOUND CULTURE
Gram Stain: NONE SEEN
Gram Stain: NONE SEEN

## 2016-08-31 NOTE — Progress Notes (Signed)
Encounter reviewed by Dr. Deshawn Skelley Amundson C. Silva.  

## 2016-10-06 DIAGNOSIS — M5441 Lumbago with sciatica, right side: Secondary | ICD-10-CM | POA: Diagnosis not present

## 2016-10-06 DIAGNOSIS — M5442 Lumbago with sciatica, left side: Secondary | ICD-10-CM | POA: Diagnosis not present

## 2016-10-06 DIAGNOSIS — G8929 Other chronic pain: Secondary | ICD-10-CM | POA: Diagnosis not present

## 2016-12-02 DIAGNOSIS — H40013 Open angle with borderline findings, low risk, bilateral: Secondary | ICD-10-CM | POA: Diagnosis not present

## 2016-12-20 DIAGNOSIS — H6063 Unspecified chronic otitis externa, bilateral: Secondary | ICD-10-CM | POA: Diagnosis not present

## 2017-04-05 ENCOUNTER — Ambulatory Visit (INDEPENDENT_AMBULATORY_CARE_PROVIDER_SITE_OTHER): Payer: 59 | Admitting: Urgent Care

## 2017-04-05 VITALS — BP 151/94 | HR 88 | Temp 97.9°F | Resp 18 | Ht 66.0 in | Wt 290.0 lb

## 2017-04-05 DIAGNOSIS — R059 Cough, unspecified: Secondary | ICD-10-CM

## 2017-04-05 DIAGNOSIS — J029 Acute pharyngitis, unspecified: Secondary | ICD-10-CM

## 2017-04-05 DIAGNOSIS — R05 Cough: Secondary | ICD-10-CM | POA: Diagnosis not present

## 2017-04-05 LAB — POCT RAPID STREP A (OFFICE): Rapid Strep A Screen: NEGATIVE

## 2017-04-05 MED ORDER — HYDROCODONE-HOMATROPINE 5-1.5 MG/5ML PO SYRP: 5.0000 mL | ORAL_SOLUTION | Freq: Every evening | ORAL | 0 refills | Status: DC | PRN

## 2017-04-05 MED ORDER — AMOXICILLIN 875 MG PO TABS: 875.0000 mg | ORAL_TABLET | Freq: Two times a day (BID) | ORAL | 0 refills | Status: DC

## 2017-04-05 MED FILL — HYDROCODONE-HOMATROPINE SYR: 5-1.5 | 20 days supply | Qty: 100 | Fill #0

## 2017-04-05 MED FILL — AMOXICILLIN 875 MG TABLET: 875 | 10 days supply | Qty: 20 | Fill #0

## 2017-04-05 NOTE — Patient Instructions (Addendum)
Pharyngitis Pharyngitis is redness, pain, and swelling (inflammation) of your pharynx. What are the causes? Pharyngitis is usually caused by infection. Most of the time, these infections are from viruses (viral) and are part of a cold. However, sometimes pharyngitis is caused by bacteria (bacterial). Pharyngitis can also be caused by allergies. Viral pharyngitis may be spread from person to person by coughing, sneezing, and personal items or utensils (cups, forks, spoons, toothbrushes). Bacterial pharyngitis may be spread from person to person by more intimate contact, such as kissing. What are the signs or symptoms? Symptoms of pharyngitis include:  Sore throat.  Tiredness (fatigue).  Low-grade fever.  Headache.  Joint pain and muscle aches.  Skin rashes.  Swollen lymph nodes.  Plaque-like film on throat or tonsils (often seen with bacterial pharyngitis).  How is this diagnosed? Your health care provider will ask you questions about your illness and your symptoms. Your medical history, along with a physical exam, is often all that is needed to diagnose pharyngitis. Sometimes, a rapid strep test is done. Other lab tests may also be done, depending on the suspected cause. How is this treated? Viral pharyngitis will usually get better in 3-4 days without the use of medicine. Bacterial pharyngitis is treated with medicines that kill germs (antibiotics). Follow these instructions at home:  Drink enough water and fluids to keep your urine clear or pale yellow.  Only take over-the-counter or prescription medicines as directed by your health care provider: ? If you are prescribed antibiotics, make sure you finish them even if you start to feel better. ? Do not take aspirin.  Get lots of rest.  Gargle with 8 oz of salt water ( tsp of salt per 1 qt of water) as often as every 1-2 hours to soothe your throat.  Throat lozenges (if you are not at risk for choking) or sprays may be used to  soothe your throat. Contact a health care provider if:  You have large, tender lumps in your neck.  You have a rash.  You cough up green, yellow-brown, or bloody spit. Get help right away if:  Your neck becomes stiff.  You drool or are unable to swallow liquids.  You vomit or are unable to keep medicines or liquids down.  You have severe pain that does not go away with the use of recommended medicines.  You have trouble breathing (not caused by a stuffy nose). This information is not intended to replace advice given to you by your health care provider. Make sure you discuss any questions you have with your health care provider. Document Released: 09/13/2005 Document Revised: 02/19/2016 Document Reviewed: 05/21/2013 Elsevier Interactive Patient Education  2017 [employer].     IF you received an x-ray today, you will receive an invoice from [facility]. Please contact [facility] at [phone] with questions or concerns regarding your invoice.   IF you received labwork today, you will receive an invoice from [facility]. Please contact [facility] at [phone] with questions or concerns regarding your invoice.   Our billing staff will not be able to assist you with questions regarding bills from these companies.  You will be contacted with the lab results as soon as they are available. The fastest way to get your results is to activate your My Chart account. Instructions are located on the last page of this paperwork. If you have not heard from [name] regarding the results in 2 weeks, please contact this office.

## 2017-04-05 NOTE — Progress Notes (Signed)
  MRN: [id number] DOB: 1960-10-02  Subjective:   Hannah Gibson is a 56 y.o. female presenting for chief complaint of Sore Throat (x3 days)  Reports 4 day history of worsening sore throat, mostly dry cough, sinus congestion. Has had some wheezing at night, cough is worse at night. Has tried Alka-seltzer cold medication. Denies fever, sinus pain, ear pain, ear drainage, chest pain, shob, n/v, abdominal pain, rashes. Denies history of asthma, allergies. Denies smoking cigarettes.  Hannah Gibson has a current medication list which includes the following prescription(s): valacyclovir. Also has No Known Allergies. Hannah Gibson  has a past medical history of HSV infection. Also  has a past surgical history that includes Myomectomy (1998) and Total abdominal hysterectomy w/ bilateral salpingoophorectomy (12/03/1997).  Objective:   Vitals: BP (!) 151/94   Pulse 88   Temp 97.9 F (36.6 C) (Oral)   Resp 18   Ht 5\' 6"  (1.676 m)   Wt 290 lb (131.5 kg)   LMP 12/09/1997 (Exact Date)   SpO2 98%   BMI 46.81 kg/m   BP Readings from Last 3 Encounters:  04/05/17 (!) 151/94  08/27/16 126/84  07/23/15 120/84    Physical Exam  Constitutional: She is oriented to person, place, and time. She appears well-developed and well-nourished.  HENT:  TM's intact bilaterally, no effusions or erythema. Nasal turbinates pink and moist, nasal passages patent. No sinus tenderness. Throat with tonsillar erythema, left-sided exudate, mucous membranes moist.  Eyes: Right eye exhibits no discharge. Left eye exhibits no discharge.  Neck: Normal range of motion. Neck supple.  Cardiovascular: Normal rate, regular rhythm and intact distal pulses.  Exam reveals no gallop and no friction rub.   No murmur heard. Pulmonary/Chest: No respiratory distress. She has no wheezes. She has no rales.  Lymphadenopathy:    She has cervical adenopathy (bilateral).  Neurological: She is alert and oriented to person, place, and time.    Results for orders placed or performed in visit on 04/05/17 (from the past 24 hour(s))  POCT rapid strep A     Status: None   Collection Time: 04/05/17  3:38 PM  Result Value Ref Range   Rapid Strep A Screen Negative Negative   Assessment and Plan :   1. Acute pharyngitis, unspecified etiology 2. Sore throat - Start amoxicillin to cover for pharyngitis given physical exam findings. Will mail out strep culture results.  3. Cough - Use hycodan for cough suppression.  Jaynee Eagles, PA-C [facility] [phone] 04/05/2017  3:48 PM

## 2017-04-07 ENCOUNTER — Encounter: Payer: Self-pay | Admitting: Registered"

## 2017-04-07 ENCOUNTER — Encounter: Payer: 59 | Attending: Family Medicine | Admitting: Registered"

## 2017-04-07 DIAGNOSIS — Z713 Dietary counseling and surveillance: Secondary | ICD-10-CM | POA: Diagnosis not present

## 2017-04-07 NOTE — Progress Notes (Signed)
Medical Nutrition Therapy:  Appt start time: 5456 end time:  1505   Assessment:  Primary concerns today: Pt states she would like to lose weight and getting healthy. Pt states she has just one experience with dieting when she was in mid-late 20s, lost 66 lbs at a weight loss clinic, but over time pt states she gained the weight back. Pt states she used to work out when work schedule was more flexible, but since starting at [employer] 10 years ago has not had much physical activity.   Pt states her energy level very low especially on days after working 12 hr shifts.  Pt states she has no issues with sleep or managing stress.  Pt states meal convenience is important to her and the shakes she does in the mornings seem to be working for her. Pt states time, energy and it is just her at home are reasons she doesn't cook more. Pt states sometimes she will cook meat on the grill.  Preferred Learning Style:   Auditory  Visual  Hands on  Learning Readiness:   Ready  MEDICATIONS: reviewed   DIETARY INTAKE:  Usual eating pattern includes 2-3 meals and no snacks per day.  Everyday foods include protein shakes, likes convenience.  24-hr recall:  B ( AM): protein shake Jeannetta Nap 'Secure') Snk ( AM): none  L ( PM): cafeteria vegetable, meat Snk ( PM): none D ( PM): fast food (ex: chick fil-a, popeyes)  Snk ( PM): none Beverages: water, coffee in am splenda, cream  Usual physical activity: ADLs pt states she has some limitation with torn ligaments in knees  Estimated energy needs: 1600 calories 180 g carbohydrates 120 g protein 44 g fat  Progress Towards Goal(s):  In progress.   Nutritional Diagnosis:  NI-5.8.5 Inadeqate fiber intake As related to lack of fruits and veggies in diet.  As evidenced by diet recall .    Intervention:  Nutrition Education. Discussed importance of eating balanced meals. Discussed ways to include more fruits and vegetables in the diet Discussed  factors affecting weight and body size.   Plan:  Consider getting back into water aerobics  Fairlife is lactose free and should be easy to digest.  Soy milk is a good alternative that is a good source of protein  Consider making smoothie with fruits and greens  Consider doing meal prep on your days off so you can eat dinner before you leave work.  Teaching Method Utilized:  Visual Auditory  Handouts given during visit include:  My Plate Planner  Barriers to learning/adherence to lifestyle change: none  Demonstrated degree of understanding via:  Teach Back   Monitoring/Evaluation:  Dietary intake, exercise, and body weight prn.

## 2017-04-07 NOTE — Patient Instructions (Addendum)
   Consider getting back into water aerobics  Fairlife is lactose free and should be easy to digest.  Soy milk is a good alternative that is a good source of protein  Consider making smoothie with fruits and greens  Consider doing meal prep on your days off so you can eat dinner before you leave work.

## 2017-04-09 LAB — CULTURE, GROUP A STREP: Strep A Culture: NEGATIVE

## 2017-04-11 ENCOUNTER — Ambulatory Visit (INDEPENDENT_AMBULATORY_CARE_PROVIDER_SITE_OTHER): Payer: 59 | Admitting: Urgent Care

## 2017-04-11 VITALS — BP 155/91 | HR 72 | Temp 98.4°F | Resp 18 | Ht 66.0 in | Wt 282.2 lb

## 2017-04-11 DIAGNOSIS — J029 Acute pharyngitis, unspecified: Secondary | ICD-10-CM

## 2017-04-11 DIAGNOSIS — R059 Cough, unspecified: Secondary | ICD-10-CM

## 2017-04-11 DIAGNOSIS — Z832 Family history of diseases of the blood and blood-forming organs and certain disorders involving the immune mechanism: Secondary | ICD-10-CM

## 2017-04-11 DIAGNOSIS — R05 Cough: Secondary | ICD-10-CM | POA: Diagnosis not present

## 2017-04-11 DIAGNOSIS — R030 Elevated blood-pressure reading, without diagnosis of hypertension: Secondary | ICD-10-CM | POA: Diagnosis not present

## 2017-04-11 NOTE — Patient Instructions (Addendum)
Try Zyrtec 10mg  to address any post-nasal drainage that may be contributing to your cough. If your cough is still around in 2 weeks, please come back to see me so that we can get a chest x-ray and rule out something more serious.    IF you received an x-ray today, you will receive an invoice from [facility]. Please contact [facility] at [phone] with questions or concerns regarding your invoice.   IF you received labwork today, you will receive an invoice from [facility]. Please contact [facility] at [phone] with questions or concerns regarding your invoice.   Our billing staff will not be able to assist you with questions regarding bills from these companies.  You will be contacted with the lab results as soon as they are available. The fastest way to get your results is to activate your My Chart account. Instructions are located on the last page of this paperwork. If you have not heard from [name] regarding the results in 2 weeks, please contact this office.

## 2017-04-11 NOTE — Progress Notes (Signed)
   MRN: [id number] DOB: Jun 10, 1961  Subjective:   Hannah Gibson is a 56 y.o. female presenting for follow up on pharyngitis. Patient was last seen 04/05/2017, started on amoxicillin to cover for strep pharyngitis. Today, patient reports that she is significantly better. Still has a cough that has been there for a "few weeks". States that her cough is mainly there at night while she is asleep or laying down. Denies fever, sinus pain, sinus congestion, chest pain, shob, n/v, abdominal pain, rashes, decreased appetite, weight loss. She has a sister that was diagnosed with sarcoidosis a few years ago. Denies smoking cigarettes. Regarding her blood pressure, patient denies history of HTN. She denies ever taking blood pressure medications. Admits that she generally feels rushed at our clinic and feels that this may be the reason it has been high at our clinic.  Zoria has a current medication list which includes the following prescription(s): amoxicillin, hydrocodone-homatropine, multivitamin, and valacyclovir. Also has No Known Allergies. Justus  has a past medical history of HSV infection. Also  has a past surgical history that includes Myomectomy (1998) and Total abdominal hysterectomy w/ bilateral salpingoophorectomy (12/03/1997).  Objective:   Vitals: BP (!) 155/91   Pulse 72   Temp 98.4 F (36.9 C) (Oral)   Resp 18   Ht 5\' 6"  (1.676 m)   Wt 282 lb 3.2 oz (128 kg)   LMP 12/09/1997 (Exact Date)   SpO2 98%   BMI 45.55 kg/m   BP Readings from Last 3 Encounters:  04/11/17 (!) 155/91  04/05/17 (!) 151/94  08/27/16 126/84    Physical Exam  Constitutional: She is oriented to person, place, and time. She appears well-developed and well-nourished.  HENT:  TM's intact bilaterally, no effusions or erythema. Nasal turbinates pink and moist, nasal passages patent. No sinus tenderness. Oropharynx clear, mucous membranes moist.  Eyes: Right eye exhibits no discharge. Left eye exhibits no  discharge. No scleral icterus.  Neck: Normal range of motion. Neck supple.  Cardiovascular: Normal rate, regular rhythm and intact distal pulses.  Exam reveals no gallop and no friction rub.   No murmur heard. Pulmonary/Chest: No respiratory distress. She has no wheezes. She has no rales.  Lymphadenopathy:    She has no cervical adenopathy.  Neurological: She is alert and oriented to person, place, and time.  Skin: Skin is warm and dry.  Psychiatric: She has a normal mood and affect.   Assessment and Plan :   1. Acute pharyngitis, unspecified etiology - Significant improvement in symptoms and physical exam findings. Finish course of amoxicillin.  2. Cough 3. Family history of sarcoidosis - Patient declines further work up for now. She will rtc in 2 weeks if her cough persists. In the meantime, she will try Zyrtec to address any post-nasal drainage as a source of her cough.  4. Elevated blood pressure reading - Patient will monitor her BP at work. Plans on checking it once weekly over the next 4 weeks and will rtc if it remains elevated.  Jaynee Eagles, PA-C [facility] [phone] 04/11/2017 3:15 PM

## 2017-07-27 ENCOUNTER — Other Ambulatory Visit: Payer: Self-pay | Admitting: Certified Nurse Midwife

## 2017-07-27 DIAGNOSIS — Z139 Encounter for screening, unspecified: Secondary | ICD-10-CM

## 2017-08-23 ENCOUNTER — Ambulatory Visit: Payer: Self-pay

## 2017-10-04 DIAGNOSIS — H401131 Primary open-angle glaucoma, bilateral, mild stage: Secondary | ICD-10-CM | POA: Diagnosis not present

## 2017-10-10 ENCOUNTER — Ambulatory Visit: Payer: 59 | Admitting: Podiatry

## 2017-10-14 DIAGNOSIS — Z136 Encounter for screening for cardiovascular disorders: Secondary | ICD-10-CM | POA: Diagnosis not present

## 2017-10-14 DIAGNOSIS — Z131 Encounter for screening for diabetes mellitus: Secondary | ICD-10-CM | POA: Diagnosis not present

## 2017-10-14 DIAGNOSIS — Z0000 Encounter for general adult medical examination without abnormal findings: Secondary | ICD-10-CM | POA: Diagnosis not present

## 2017-10-25 DIAGNOSIS — H401131 Primary open-angle glaucoma, bilateral, mild stage: Secondary | ICD-10-CM | POA: Diagnosis not present

## 2017-10-28 MED FILL — TRAVATAN Z 0.004% EYE DROP: 0.004 | 30 days supply | Qty: 3 | Fill #0

## 2017-12-30 DIAGNOSIS — J45909 Unspecified asthma, uncomplicated: Secondary | ICD-10-CM | POA: Diagnosis not present

## 2017-12-30 DIAGNOSIS — J309 Allergic rhinitis, unspecified: Secondary | ICD-10-CM | POA: Diagnosis not present

## 2017-12-30 MED FILL — VENTOLIN HFA 90 MCG INHALER: 108 (90 BAS | 30 days supply | Qty: 18 | Fill #0

## 2017-12-30 MED FILL — FLUTICASONE PROP 50 MCG SPR: 50 | 30 days supply | Qty: 16 | Fill #0

## 2018-01-20 ENCOUNTER — Other Ambulatory Visit: Payer: Self-pay | Admitting: Physician Assistant

## 2018-01-20 ENCOUNTER — Ambulatory Visit
Admission: RE | Admit: 2018-01-20 | Discharge: 2018-01-20 | Disposition: A | Discharge status: Home / Self Care | Payer: 59 | Source: Ambulatory Visit | Attending: Physician Assistant | Admitting: Physician Assistant

## 2018-01-20 DIAGNOSIS — R059 Cough, unspecified: Secondary | ICD-10-CM

## 2018-01-20 DIAGNOSIS — R062 Wheezing: Secondary | ICD-10-CM

## 2018-01-20 DIAGNOSIS — R05 Cough: Secondary | ICD-10-CM | POA: Diagnosis not present

## 2018-01-20 MED FILL — predniSONE 20 MG TABS: 20 | 6 days supply | Qty: 12 | Fill #0

## 2018-01-27 MED FILL — TRAVATAN Z 0.004% EYE DROP: 0.004 | 30 days supply | Qty: 3 | Fill #1

## 2018-02-09 DIAGNOSIS — H401131 Primary open-angle glaucoma, bilateral, mild stage: Secondary | ICD-10-CM | POA: Diagnosis not present

## 2018-02-21 MED FILL — VENTOLIN HFA 90 MCG INHALER: 108 (90 BAS | 25 days supply | Qty: 18 | Fill #0

## 2018-03-06 ENCOUNTER — Telehealth: Payer: Self-pay | Admitting: Obstetrics and Gynecology

## 2018-03-06 MED ORDER — VALACYCLOVIR HCL 500 MG PO TABS: 500.0000 mg | ORAL_TABLET | Freq: Two times a day (BID) | ORAL | 1 refills | Status: DC

## 2018-03-06 NOTE — Telephone Encounter (Signed)
Spoke with patient. Rx for valtrex 500 mg po BID x 3 days as needed #30 1RF sent to pharmacy on file. Patient verbalizes understanding. Will close encounter.

## 2018-03-06 NOTE — Telephone Encounter (Signed)
Patient states that she takes Valtrex 1 gram twice daily as needed for outbreaks. Has a history of HSV 2. Has a aex scheduled for 03/20/2018 with Dr.Jertson.  Routing to [place] for review of refill.

## 2018-03-06 NOTE — Telephone Encounter (Signed)
Left message to call Kaitlyn at [phone]. 

## 2018-03-06 NOTE — Telephone Encounter (Signed)
The standard dose is 500 mg BID x 3 days with an outbreak, or one gram qd x 5 days. It looks like her last script was for 1000 mg BID, #90 with refills. Unless it hasn't worked for her, I would call in 500 mg bid x 3 days as needed, #30 with 1 refill.

## 2018-03-06 NOTE — Telephone Encounter (Signed)
Patient requests refill of Valtrex 500mg  twice daily. Patient is a former patient of Kem Boroughs but is scheduled for an annual on 03/20/18 with Sumner Boast.   Patient uses [facility] [address]

## 2018-03-20 ENCOUNTER — Ambulatory Visit: Payer: 59 | Admitting: Obstetrics and Gynecology

## 2018-04-18 DIAGNOSIS — M51379 Other intervertebral disc degeneration, lumbosacral region without mention of lumbar back pain or lower extremity pain: Secondary | ICD-10-CM | POA: Insufficient documentation

## 2018-04-18 DIAGNOSIS — M5137 Other intervertebral disc degeneration, lumbosacral region: Secondary | ICD-10-CM | POA: Insufficient documentation

## 2018-05-04 ENCOUNTER — Encounter: Payer: Self-pay | Admitting: Certified Nurse Midwife

## 2018-05-04 ENCOUNTER — Ambulatory Visit: Payer: 59 | Admitting: Certified Nurse Midwife

## 2018-05-17 DIAGNOSIS — H401131 Primary open-angle glaucoma, bilateral, mild stage: Secondary | ICD-10-CM | POA: Diagnosis not present

## 2018-05-17 MED FILL — TRAVATAN Z 0.004% EYE DROP: 0.004 | 30 days supply | Qty: 3 | Fill #2

## 2018-05-19 MED FILL — VENTOLIN HFA 90 MCG INHALER: 108 (90 BAS | 25 days supply | Qty: 18 | Fill #0

## 2018-05-19 MED FILL — FLUTICASONE PROP 50 MCG SPR: 50 | 60 days supply | Qty: 16 | Fill #1

## 2018-06-26 MED FILL — TRAVATAN Z 0.004% EYE DROP: 0.004 | 25 days supply | Qty: 3 | Fill #0

## 2018-07-13 MED FILL — VENTOLIN HFA 90 MCG INHALER: 108 (90 BAS | 25 days supply | Qty: 18 | Fill #0

## 2018-07-28 ENCOUNTER — Ambulatory Visit (INDEPENDENT_AMBULATORY_CARE_PROVIDER_SITE_OTHER): Payer: 59 | Admitting: Allergy

## 2018-07-28 ENCOUNTER — Encounter: Payer: Self-pay | Admitting: Allergy

## 2018-07-28 VITALS — BP 132/108 | HR 96 | Temp 98.2°F | Resp 12 | Ht 66.5 in | Wt 278.6 lb

## 2018-07-28 DIAGNOSIS — J310 Chronic rhinitis: Secondary | ICD-10-CM

## 2018-07-28 DIAGNOSIS — J4550 Severe persistent asthma, uncomplicated: Secondary | ICD-10-CM

## 2018-07-28 MED ORDER — FLUTICASONE PROPIONATE 93 MCG/ACT NA EXHU: 2.0000 | INHALANT_SUSPENSION | Freq: Two times a day (BID) | NASAL | 5 refills | Status: DC

## 2018-07-28 MED ORDER — MONTELUKAST SODIUM 10 MG PO TABS: 10.0000 mg | ORAL_TABLET | Freq: Every day | ORAL | 5 refills | Status: DC

## 2018-07-28 MED ORDER — BUDESONIDE-FORMOTEROL FUMARATE 160-4.5 MCG/ACT IN AERO: 2.0000 | INHALATION_SPRAY | Freq: Two times a day (BID) | RESPIRATORY_TRACT | 5 refills | Status: DC

## 2018-07-28 NOTE — Patient Instructions (Addendum)
Asthma - adult onset - have access to albuterol inhaler 2 puffs every 4-6 hours as needed for cough/wheeze/shortness of breath/chest tightness.  May use 15-20 minutes prior to activity.   Monitor frequency of use.   - start singulair 10mg  daily - take at bedtime - start symbicort 154mcg 2 puffs twice a day - will obtain CBC w diff to assess degree of eosinophilia  Asthma control goals:   Full participation in all desired activities (may need albuterol before activity)  Albuterol use two time or less a week on average (not counting use with activity)  Cough interfering with sleep two time or less a month  Oral steroids no more than once a year  No hospitalizations  Nasal congestion - environmental allergy testing is negative today thus will obtain serum IgE levels - start use of Xhance nasal spray device.  Use 2 sprays each nostril twice a day.  This spray is Fluticasone however the device allows for deeper deposition of the medication into the sinuses for better improvement.  - recommend use of saline nasal rinse prior to nasal spray use to help clean out the nose and keep nose moisturized   Follow-up in 3 months or sooner if needed

## 2018-07-28 NOTE — Progress Notes (Signed)
New Patient Note  RE: Hannah Gibson MRN: [id number] DOB: June 25, 1961 Date of Office Visit: 07/28/2018  Referring provider: Kelton Pillar, MD Primary care provider: Kelton Pillar, MD  Chief Complaint: cough and allergies  History of present illness: AERIANNA Gibson is a 57 y.o. female presenting today for consultation for allergies and cough.   She has nasal congestion primarily with occasional sneezing since around March 2019.  She does feel symptoms fluctuated with season changes.  She states initially was using OTC nasal sprays (Afrin) which she state she used for about a month and was using it multiple times a day but felt she was using it to much thus stopped.  She has also tried use of Allegra-D which she does feel helped.  She state she did try taking plain Allegra as well which didn't help her congestion.   She states she has been having problems with cough and wheeze that she feel is due to allergies.  Cough is worse in the mornings and night.  She does report SOB and chest tightness as well.  Albuterol does help with the wheezing and cough.  She is using albuterol couple times a day everyday.  She is having nighttime awakenings most nights.  She did have a steroid injection for her respiratory symptoms as well prednisone pills since March from her PCP.     Denies preceding illness prior to March.  She states her mother and grandmother had asthma and grandmother died from asthma attack.   She has history of childhood eczema which is no longer an issue.  She does not have any history of food allergy.    Review of systems: Review of Systems  Constitutional: Negative for chills, fever and malaise/fatigue.  HENT: Positive for congestion. Negative for ear discharge, nosebleeds, sinus pain and sore throat.   Eyes: Negative for pain, discharge and redness.  Respiratory: Positive for cough, shortness of breath and wheezing. Negative for hemoptysis and sputum production.     Cardiovascular: Negative for chest pain.  Gastrointestinal: Negative for abdominal pain, constipation, diarrhea, heartburn, nausea and vomiting.  Musculoskeletal: Negative for joint pain.  Skin: Negative for itching and rash.  Neurological: Negative for headaches.    All other systems negative unless noted above in HPI  Past medical history: Past Medical History:  Diagnosis Date  . HSV infection     Past surgical history: Past Surgical History:  Procedure Laterality Date  . MYOMECTOMY  1998  . TOTAL ABDOMINAL HYSTERECTOMY W/ BILATERAL SALPINGOOPHORECTOMY  12/03/1997   secondary to fibroids    Family history:  Family History  Problem Relation Age of Onset  . Diabetes Mother   . Hypertension Mother   . Kidney disease Mother   . Glaucoma Mother   . Glaucoma Sister   . Diabetes Maternal Aunt     Social history: Lives in a townhome with carpeting with electric heating and central cooling.  No pets in the home.  No concern for water damage, mildew or roaches in the home.  She is a surgical tech in operating room.  Denies smoking history.    Medication List: Allergies as of 07/28/2018   No Known Allergies     Medication List        Accurate as of 07/28/18  3:55 PM. Always use your most recent med list.          budesonide-formoterol 160-4.5 MCG/ACT inhaler Commonly known as:  SYMBICORT Inhale 2 puffs into the lungs 2 (two) times  daily.   DUEXIS 800-26.6 MG Tabs Generic drug:  Ibuprofen-Famotidine Take 1 tablet by mouth 3 (three) times daily.   Fluticasone Propionate 93 MCG/ACT Exhu Place 2 sprays into both nostrils 2 (two) times daily.   montelukast 10 MG tablet Commonly known as:  SINGULAIR Take 1 tablet (10 mg total) by mouth at bedtime.   multivitamin tablet Take 1 tablet by mouth daily.   TRAVATAN Z 0.004 % Soln ophthalmic solution Generic drug:  Travoprost (BAK Free)   valACYclovir 500 MG tablet Commonly known as:  VALTREX Take 1 tablet (500 mg  total) by mouth 2 (two) times daily. X 3 days as needed   VENTOLIN HFA 108 (90 Base) MCG/ACT inhaler Generic drug:  albuterol       Known medication allergies: No Known Allergies   Physical examination: Blood pressure (!) 132/108, pulse 96, temperature 98.2 F (36.8 C), temperature source Oral, resp. rate 12, height 5' 6.5" (1.689 m), weight 278 lb 9.6 oz (126.4 kg), last menstrual period 12/09/1997, SpO2 95 %.  General: Alert, interactive, in no acute distress. HEENT: PERRLA, TMs pearly gray, turbinates markedly edematous with clear discharge, post-pharynx non erythematous. Neck: Supple without lymphadenopathy. Lungs: Mildly decreased breath sounds with expiratory wheezing bilaterally. {no increased work of breathing.  Increased aeration s/p BD with improved wheeze CV: Normal S1, S2 without murmurs. Abdomen: Nondistended, nontender. Skin: Warm and dry, without lesions or rashes. Extremities:  No clubbing, cyanosis or edema. Neuro:   Grossly intact.  Diagnositics/Labs:  Spirometry: FEV1: 1.83L 74%, FVC: 2.06L 66%.   Post-BD she had 15% improvement in FEV1 to 2.11L 85% which normalized.  FVC improved to 2.48L 79%.  This improvement is significant.   Allergy testing: environmental allergy skin prick testing is negative.  Histamine control is mildly reactive.  Allergy testing results were read and interpreted by provider, documented by clinical staff.  Assessment and plan:   Asthma, severe persistent - adult onset - have access to albuterol inhaler 2 puffs every 4-6 hours as needed for cough/wheeze/shortness of breath/chest tightness.  May use 15-20 minutes prior to activity.   Monitor frequency of use.   - start singulair 10mg  daily - take at bedtime - start symbicort 169mcg 2 puffs twice a day - will obtain CBC w diff to assess degree of eosinophilia  Asthma control goals:   Full participation in all desired activities (may need albuterol before activity)  Albuterol use  two time or less a week on average (not counting use with activity)  Cough interfering with sleep two time or less a month  Oral steroids no more than once a year  No hospitalizations  Nasal congestion/Chronic rhinitis - environmental allergy testing is negative today thus will obtain serum IgE levels - start use of Xhance nasal spray device.  Use 2 sprays each nostril twice a day.  This spray is Fluticasone however the device allows for deeper deposition of the medication into the sinuses for better improvement.  Sample provided today - singulair as above - recommend use of saline nasal rinse prior to nasal spray use to help clean out the nose and keep nose moisturized   Follow-up in 3 months or sooner if needed  I appreciate the opportunity to take part in Johneisha's care. Please do not hesitate to contact me with questions.  Sincerely,   Prudy Feeler, MD Allergy/Immunology [facility] [place] [facility]

## 2018-07-31 LAB — ALLERGENS W/TOTAL IGE AREA 2
Alternaria Alternata IgE: <0.1 kU/L
Aspergillus Fumigatus IgE: <0.1 kU/L
Bermuda Grass IgE: <0.1 kU/L
Cat Dander IgE: <0.1 kU/L
Cedar, Mountain IgE: <0.1 kU/L
Cladosporium Herbarum IgE: <0.1 kU/L
Cockroach, German IgE: <0.1 kU/L
Common Silver Birch IgE: <0.1 kU/L
Cottonwood IgE: <0.1 kU/L
D Farinae IgE: <0.1 kU/L
D Pteronyssinus IgE: <0.1 kU/L
Dog Dander IgE: <0.1 kU/L
Elm, American IgE: <0.1 kU/L
IgE (Immunoglobulin E), Serum: 1070 IU/mL — ABNORMAL HIGH (ref 6–495)
Johnson Grass IgE: <0.1 kU/L
Maple/Box Elder IgE: <0.1 kU/L
Mouse Urine IgE: <0.1 kU/L
Oak, White IgE: <0.1 kU/L
Pecan, Hickory IgE: <0.1 kU/L
Penicillium Chrysogen IgE: <0.1 kU/L
Pigweed, Rough IgE: <0.1 kU/L
Ragweed, Short IgE: <0.1 kU/L
Sheep Sorrel IgE Qn: <0.1 kU/L
Timothy Grass IgE: <0.1 kU/L
White Mulberry IgE: <0.1 kU/L

## 2018-07-31 LAB — CBC WITH DIFFERENTIAL
Basophils Absolute: 0 10*3/uL (ref 0.0–0.2)
Basos: 1 %
EOS (ABSOLUTE): 0.5 10*3/uL — ABNORMAL HIGH (ref 0.0–0.4)
Eos: 9 %
Hematocrit: 43 % (ref 34.0–46.6)
Hemoglobin: 14 g/dL (ref 11.1–15.9)
Immature Grans (Abs): 0 10*3/uL (ref 0.0–0.1)
Immature Granulocytes: 0 %
Lymphocytes Absolute: 1.9 10*3/uL (ref 0.7–3.1)
Lymphs: 33 %
MCH: 28.6 pg (ref 26.6–33.0)
MCHC: 32.6 g/dL (ref 31.5–35.7)
MCV: 88 fL (ref 79–97)
Monocytes Absolute: 0.4 10*3/uL (ref 0.1–0.9)
Monocytes: 7 %
Neutrophils Absolute: 3 10*3/uL (ref 1.4–7.0)
Neutrophils: 50 %
RBC: 4.89 x10E6/uL (ref 3.77–5.28)
RDW: 12.2 % — ABNORMAL LOW (ref 12.3–15.4)
WBC: 5.9 10*3/uL (ref 3.4–10.8)

## 2018-08-15 MED FILL — TRAVATAN Z 0.004% EYE DROP: 0.004 | 25 days supply | Qty: 3 | Fill #1

## 2018-08-17 ENCOUNTER — Other Ambulatory Visit: Payer: Self-pay | Admitting: Obstetrics and Gynecology

## 2018-08-17 ENCOUNTER — Other Ambulatory Visit: Payer: Self-pay | Admitting: Family Medicine

## 2018-08-17 DIAGNOSIS — Z1231 Encounter for screening mammogram for malignant neoplasm of breast: Secondary | ICD-10-CM

## 2018-08-21 DIAGNOSIS — H401131 Primary open-angle glaucoma, bilateral, mild stage: Secondary | ICD-10-CM | POA: Diagnosis not present

## 2018-08-31 ENCOUNTER — Ambulatory Visit
Admission: RE | Admit: 2018-08-31 | Discharge: 2018-08-31 | Disposition: A | Discharge status: Home / Self Care | Payer: 59 | Source: Ambulatory Visit | Attending: Family Medicine | Admitting: Family Medicine

## 2018-08-31 DIAGNOSIS — Z1231 Encounter for screening mammogram for malignant neoplasm of breast: Secondary | ICD-10-CM

## 2018-08-31 MED FILL — MONTELUKAST SOD 10 MG TAB: 10 | 30 days supply | Qty: 30 | Fill #0

## 2018-09-26 MED FILL — SYMBICORT 160-4.5 MCG INH: 160-4.5 | 30 days supply | Qty: 10 | Fill #0

## 2018-09-26 MED FILL — MONTELUKAST SOD 10 MG TAB: 10 | 30 days supply | Qty: 30 | Fill #1

## 2018-10-05 ENCOUNTER — Encounter (INDEPENDENT_AMBULATORY_CARE_PROVIDER_SITE_OTHER): Payer: 59

## 2018-10-11 ENCOUNTER — Ambulatory Visit (INDEPENDENT_AMBULATORY_CARE_PROVIDER_SITE_OTHER): Payer: 59 | Admitting: Family Medicine

## 2018-10-11 ENCOUNTER — Encounter (INDEPENDENT_AMBULATORY_CARE_PROVIDER_SITE_OTHER): Payer: Self-pay | Admitting: Family Medicine

## 2018-10-11 VITALS — BP 159/90 | HR 53 | Temp 97.3°F | Ht 66.0 in | Wt 281.0 lb

## 2018-10-11 DIAGNOSIS — Z9189 Other specified personal risk factors, not elsewhere classified: Secondary | ICD-10-CM

## 2018-10-11 DIAGNOSIS — R0602 Shortness of breath: Secondary | ICD-10-CM | POA: Diagnosis not present

## 2018-10-11 DIAGNOSIS — Z1331 Encounter for screening for depression: Secondary | ICD-10-CM

## 2018-10-11 DIAGNOSIS — Z6841 Body Mass Index (BMI) 40.0 and over, adult: Secondary | ICD-10-CM | POA: Diagnosis not present

## 2018-10-11 DIAGNOSIS — I10 Essential (primary) hypertension: Secondary | ICD-10-CM

## 2018-10-11 DIAGNOSIS — R5383 Other fatigue: Secondary | ICD-10-CM | POA: Diagnosis not present

## 2018-10-11 DIAGNOSIS — Z0289 Encounter for other administrative examinations: Secondary | ICD-10-CM

## 2018-10-12 LAB — LIPID PANEL
Chol/HDL Ratio: 3.7 ratio (ref 0.0–4.4)
Cholesterol, Total: 181 mg/dL (ref 100–199)
HDL: 49 mg/dL (ref >39)
LDL Calculated: 123 mg/dL — ABNORMAL HIGH (ref 0–99)
Triglycerides: 46 mg/dL (ref 0–149)
VLDL Cholesterol Cal: 9 mg/dL (ref 5–40)

## 2018-10-12 LAB — COMPREHENSIVE METABOLIC PANEL
ALT: 22 IU/L (ref 0–32)
AST: 19 IU/L (ref 0–40)
Albumin/Globulin Ratio: 1.5 (ref 1.2–2.2)
Albumin: 4.1 g/dL (ref 3.5–5.5)
Alkaline Phosphatase: 110 IU/L (ref 39–117)
BUN/Creatinine Ratio: 17 (ref 9–23)
BUN: 15 mg/dL (ref 6–24)
Bilirubin Total: 0.2 mg/dL (ref 0.0–1.2)
CO2: 21 mmol/L (ref 20–29)
Calcium: 9.5 mg/dL (ref 8.7–10.2)
Chloride: 100 mmol/L (ref 96–106)
Creatinine, Ser: 0.86 mg/dL (ref 0.57–1.00)
GFR calc Af Amer: 88 mL/min/{1.73_m2} (ref >59)
GFR calc non Af Amer: 76 mL/min/{1.73_m2} (ref >59)
Globulin, Total: 2.8 g/dL (ref 1.5–4.5)
Glucose: 82 mg/dL (ref 65–99)
Potassium: 4.8 mmol/L (ref 3.5–5.2)
Sodium: 140 mmol/L (ref 134–144)
Total Protein: 6.9 g/dL (ref 6.0–8.5)

## 2018-10-12 LAB — VITAMIN B12: Vitamin B-12: 1017 pg/mL (ref 232–1245)

## 2018-10-12 LAB — HEMOGLOBIN A1C
Est. average glucose Bld gHb Est-mCnc: 143 mg/dL
Hgb A1c MFr Bld: 6.6 % — ABNORMAL HIGH (ref 4.8–5.6)

## 2018-10-12 LAB — VITAMIN D 25 HYDROXY (VIT D DEFICIENCY, FRACTURES): Vit D, 25-Hydroxy: 31 ng/mL (ref 30.0–100.0)

## 2018-10-12 LAB — TSH: TSH: 1.88 u[IU]/mL (ref 0.450–4.500)

## 2018-10-12 LAB — T4, FREE: Free T4: 1.05 ng/dL (ref 0.82–1.77)

## 2018-10-12 LAB — FOLATE: Folate: 9.3 ng/mL (ref >3.0)

## 2018-10-12 LAB — INSULIN, RANDOM: INSULIN: 11.5 u[IU]/mL (ref 2.6–24.9)

## 2018-10-12 LAB — T3: T3, Total: 81 ng/dL (ref 71–180)

## 2018-10-12 MED FILL — TRAVOPROST (BAK FREE) 0.004: 0.004 | 25 days supply | Qty: 3 | Fill #2

## 2018-10-12 NOTE — Progress Notes (Signed)
.  Office: [phone]  /  Fax: [phone]   HPI:   Chief Complaint: [place] (MR# [id number]) is a 58 y.o. female who presents on 10/11/2018 for obesity evaluation and treatment. Current BMI is Body mass index is 45.35 kg/m.Marland Kitchen Hannah Gibson has struggled with obesity for years and has been unsuccessful in either losing weight or maintaining long term weight loss. Hannah Gibson attended our information session and states she is currently in the action stage of change and ready to dedicate time achieving and maintaining a healthier weight.   Hannah Gibson heard about our clinic through [facility] website. She is lactose intolerant.  Hannah Gibson states her family eats meals together she struggles with family and or coworkers weight loss sabotage her desired weight loss is 121 lbs she has been heavy most of  her life she started gaining weight maybe 12 years ago her heaviest weight ever was 281 lbs she has significant food cravings issues  she is frequently drinking liquids with calories she frequently makes poor food choices she frequently eats larger portions than normal  she has binge eating behaviors   Fatigue Hannah Gibson feels her energy is lower than it should be. This has worsened with weight gain and has not worsened recently. Hannah Gibson admits to daytime somnolence and  admits to waking up refreshed and still tired. Patient is at risk for obstructive sleep apnea. Patent has a history of symptoms of daytime fatigue. Patient generally gets 7 hours of sleep per night, and states they generally have generally restful sleep. Snoring is present. Apneic episodes are present. Epworth Sleepiness Score is 3.  Dyspnea on exertion Hannah Gibson notes increasing shortness of breath with exercising and seems to be worsening over time with weight gain. She notes getting out of breath sooner with activity than she used to. This has not gotten worse recently. EKG-Sinus bradycardia at 54 BPM.  Hannah Gibson denies orthopnea.  Hypertension Hannah Gibson is a 58 y.o. female with hypertension. Hannah Gibson's blood pressure is elevated today, possible white coat syndrome. She denies chest pain. She is working weight loss to help control her blood pressure with the goal of decreasing her risk of heart attack and stroke. Hannah Gibson's blood pressure is not currently controlled.  At risk for cardiovascular disease Hannah Gibson is at a higher than average risk for cardiovascular disease due to obesity and hypertension. She currently denies any chest pain.  Depression Screen Hannah Gibson's Food and Mood (modified PHQ-9) score was  Depression screen PHQ 2/9 10/11/2018  Decreased Interest 2  Down, Depressed, Hopeless 1  PHQ - 2 Score 3  Altered sleeping 0  Tired, decreased energy 2  Change in appetite 0  Feeling bad or failure about yourself  0  Trouble concentrating 0  Moving slowly or fidgety/restless 0  Suicidal thoughts 0  PHQ-9 Score 5  Difficult doing work/chores Not difficult at all    ASSESSMENT AND PLAN:  Other fatigue - Plan: EKG 12-Lead, Hemoglobin A1c, Insulin, random, Lipid panel, VITAMIN D 25 Hydroxy (Vit-D Deficiency, Fractures), Vitamin B12, Folate, T3, T4, free, TSH  Shortness of breath on exertion  Essential hypertension - Plan: Comprehensive metabolic panel  At risk for heart disease  Screening for depression  Class 3 severe obesity with serious comorbidity and body mass index (BMI) of 45.0 to 49.9 in adult, unspecified obesity type (HCC)  PLAN:  Fatigue Hannah Gibson was informed that her fatigue may be related to obesity, depression or many other causes. Labs will be ordered, and in the meanwhile [employer]  has agreed to work on diet, exercise and weight loss to help with fatigue. Proper sleep hygiene was discussed including the need for 7-8 hours of quality sleep each night. A sleep study was not ordered based on symptoms and Epworth score.  Dyspnea on  exertion Hannah Gibson's shortness of breath appears to be obesity related and exercise induced. She has agreed to work on weight loss and gradually increase exercise to treat her exercise induced shortness of breath. If Hannah Gibson follows our instructions and loses weight without improvement of her shortness of breath, we will plan to refer to pulmonology. We will monitor this condition regularly. Hannah Gibson agrees to this plan.  Hypertension We discussed sodium restriction, working on healthy weight loss, and a regular exercise program as the means to achieve improved blood pressure control. Hannah Gibson agreed with this plan and agreed to follow up as directed. We will continue to monitor her blood pressure as well as her progress with the above lifestyle modifications. She will watch for signs of hypotension as she continues her lifestyle modifications. EKG was done, and we will check CMP and FLP today. Hannah Gibson agrees to follow up with our clinic in 2 weeks.  Cardiovascular risk counselling Hannah Gibson was given extended (15 minutes) coronary artery disease prevention counseling today. She is 58 y.o. female and has risk factors for heart disease including obesity and hypertension. We discussed intensive lifestyle modifications today with an emphasis on specific weight loss instructions and strategies. Pt was also informed of the importance of increasing exercise and decreasing saturated fats to help prevent heart disease.  Depression Screen Hannah Gibson had a mildly positive depression screening. Depression is commonly associated with obesity and often results in emotional eating behaviors. We will monitor this closely and work on CBT to help improve the non-hunger eating patterns. Referral to Psychology may be required if no improvement is seen as she continues in our clinic.  Obesity Hannah Gibson is currently in the action stage of change and her goal is to continue with weight loss efforts She has agreed to  follow the Category 2 plan + 100 calories and 1oz of lean meat at breakfast Hannah Gibson has been instructed to work up to a goal of 150 minutes of combined cardio and strengthening exercise per week for weight loss and overall health benefits. We discussed the following Behavioral Modification Strategies today: increasing lean protein intake, increasing vegetables, decrease eating out, work on meal planning and easy cooking plans, and planning for success  Ashani has agreed to follow up with our clinic in 2 weeks. She was informed of the importance of frequent follow up visits to maximize her success with intensive lifestyle modifications for her multiple health conditions. She was informed we would discuss her lab results at her next visit unless there is a critical issue that needs to be addressed sooner. Nataleah agreed to keep her next visit at the agreed upon time to discuss these results.  ALLERGIES: No Known Allergies  MEDICATIONS: Current Outpatient Medications on File Prior to Visit  Medication Sig Dispense Refill  . budesonide-formoterol (SYMBICORT) 160-4.5 MCG/ACT inhaler Inhale 2 puffs into the lungs 2 (two) times daily. 1 Inhaler 5  . DUEXIS 800-26.6 MG TABS Take 1 tablet by mouth 3 (three) times daily.  6  . montelukast (SINGULAIR) 10 MG tablet Take 1 tablet (10 mg total) by mouth at bedtime. 30 tablet 5  . Multiple Vitamin (MULTIVITAMIN) tablet Take 1 tablet by mouth daily.    . TRAVATAN Z 0.004 % SOLN ophthalmic  solution   3  . VENTOLIN HFA 108 (90 Base) MCG/ACT inhaler   0   No current facility-administered medications on file prior to visit.     PAST MEDICAL HISTORY: Past Medical History:  Diagnosis Date  . Asthma   . Back pain   . HSV infection   . Joint pain   . Lactose intolerance   . Sciatica     PAST SURGICAL HISTORY: Past Surgical History:  Procedure Laterality Date  . MYOMECTOMY  1998  . TOTAL ABDOMINAL HYSTERECTOMY W/ BILATERAL SALPINGOOPHORECTOMY   12/03/1997   secondary to fibroids    SOCIAL HISTORY: Social History   Tobacco Use  . Smoking status: Never Smoker  . Smokeless tobacco: Never Used  Substance Use Topics  . Alcohol use: Yes    Alcohol/week: 5.0 standard drinks    Types: 5 Standard drinks or equivalent per week  . Drug use: No    FAMILY HISTORY: Family History  Problem Relation Age of Onset  . Diabetes Mother   . Hypertension Mother   . Kidney disease Mother   . Glaucoma Mother   . Obesity Mother   . Glaucoma Sister   . Diabetes Maternal Aunt     ROS: Review of Systems  Constitutional: Positive for malaise/fatigue. Negative for weight loss.  Eyes:       + Wear glasses or contacts  Respiratory: Positive for shortness of breath (with exertion) and wheezing.   Cardiovascular: Negative for chest pain and orthopnea.       + Leg cramping  Musculoskeletal: Positive for back pain.       + Muscle or joint pain    PHYSICAL EXAM: Blood pressure (!) 159/90, pulse (!) 53, temperature (!) 97.3 F (36.3 C), temperature source Oral, height 5\' 6"  (1.676 m), weight 281 lb (127.5 kg), last menstrual period 12/09/1997, SpO2 97 %. Body mass index is 45.35 kg/m. Physical Exam Vitals signs reviewed.  Constitutional:      Appearance: Normal appearance. She is obese.  HENT:     Head: Normocephalic and atraumatic.     Nose: Nose normal.  Eyes:     General: No scleral icterus.    Extraocular Movements: Extraocular movements intact.  Neck:     Musculoskeletal: Normal range of motion and neck supple.     Comments: No thyromegaly present Cardiovascular:     Rate and Rhythm: Regular rhythm. Bradycardia present.     Pulses: Normal pulses.     Heart sounds: Normal heart sounds.  Pulmonary:     Effort: Pulmonary effort is normal. No respiratory distress.     Breath sounds: Normal breath sounds.  Abdominal:     Palpations: Abdomen is soft.     Tenderness: There is no abdominal tenderness.     Comments: + Obesity   Musculoskeletal: Normal range of motion.     Right lower leg: No edema.     Left lower leg: No edema.  Skin:    General: Skin is warm and dry.     Comments: + Acanthosis nigricans  Neurological:     Mental Status: She is alert and oriented to person, place, and time.     Coordination: Coordination normal.  Psychiatric:        Mood and Affect: Mood normal.        Behavior: Behavior normal.     RECENT LABS AND TESTS: BMET    Component Value Date/Time   NA 140 10/11/2018 1301   K 4.8 10/11/2018 1301  CL 100 10/11/2018 1301   CO2 21 10/11/2018 1301   GLUCOSE 82 10/11/2018 1301   BUN 15 10/11/2018 1301   CREATININE 0.86 10/11/2018 1301   CALCIUM 9.5 10/11/2018 1301   GFRNONAA 76 10/11/2018 1301   GFRAA 88 10/11/2018 1301   Lab Results  Component Value Date   HGBA1C 6.6 (H) 10/11/2018   Lab Results  Component Value Date   INSULIN 11.5 10/11/2018   CBC    Component Value Date/Time   WBC 5.9 07/28/2018 1549   RBC 4.89 07/28/2018 1549   HGB 14.0 07/28/2018 1549   HGB 14.1 12/19/2012 1603   HCT 43.0 07/28/2018 1549   MCV 88 07/28/2018 1549   MCH 28.6 07/28/2018 1549   MCHC 32.6 07/28/2018 1549   RDW 12.2 (L) 07/28/2018 1549   LYMPHSABS 1.9 07/28/2018 1549   EOSABS 0.5 (H) 07/28/2018 1549   BASOSABS 0.0 07/28/2018 1549   Iron/TIBC/Ferritin/ %Sat No results found for: IRON, TIBC, FERRITIN, IRONPCTSAT Lipid Panel     Component Value Date/Time   CHOL 181 10/11/2018 1301   TRIG 46 10/11/2018 1301   HDL 49 10/11/2018 1301   CHOLHDL 3.7 10/11/2018 1301   LDLCALC 123 (H) 10/11/2018 1301   Hepatic Function Panel     Component Value Date/Time   PROT 6.9 10/11/2018 1301   ALBUMIN 4.1 10/11/2018 1301   AST 19 10/11/2018 1301   ALT 22 10/11/2018 1301   ALKPHOS 110 10/11/2018 1301   BILITOT 0.2 10/11/2018 1301      Component Value Date/Time   TSH 1.880 10/11/2018 1301    ECG  shows NSR with a rate of 54 BPM INDIRECT CALORIMETER done today shows a VO2 of 206  and a REE of 1432. Her calculated basal metabolic rate is 7209 thus her basal metabolic rate is worse than expected.       OBESITY BEHAVIORAL INTERVENTION VISIT  Today's visit was # 1   Starting weight: 281 lbs Starting date: 10/11/2018 Today's weight : 281 lbs Today's date: 10/11/2018 Total lbs lost to date: 0    ASK: We discussed the diagnosis of obesity with [name] today and Hannah Gibson agreed to give [name] permission to discuss obesity behavioral modification therapy today.  ASSESS: Tabbatha has the diagnosis of obesity and her BMI today is 45.38 Adelae is in the action stage of change   ADVISE: Sindy was educated on the multiple health risks of obesity as well as the benefit of weight loss to improve her health. She was advised of the need for long term treatment and the importance of lifestyle modifications to improve her current health and to decrease her risk of future health problems.  AGREE: Multiple dietary modification options and treatment options were discussed and  Luciel agreed to follow the recommendations documented in the above note.  ARRANGE: Idolina was educated on the importance of frequent visits to treat obesity as outlined per CMS and USPSTF guidelines and agreed to schedule her next follow up appointment today.   I, Trixie Dredge, am acting as transcriptionist for Ilene Qua, MD   I have reviewed the above documentation for accuracy and completeness, and I agree with the above. - Ilene Qua, MD

## 2018-10-17 DIAGNOSIS — Z23 Encounter for immunization: Secondary | ICD-10-CM | POA: Diagnosis not present

## 2018-10-17 DIAGNOSIS — Z0000 Encounter for general adult medical examination without abnormal findings: Secondary | ICD-10-CM | POA: Diagnosis not present

## 2018-10-17 DIAGNOSIS — Z13220 Encounter for screening for lipoid disorders: Secondary | ICD-10-CM | POA: Diagnosis not present

## 2018-10-17 DIAGNOSIS — K635 Polyp of colon: Secondary | ICD-10-CM | POA: Diagnosis not present

## 2018-10-17 DIAGNOSIS — J309 Allergic rhinitis, unspecified: Secondary | ICD-10-CM | POA: Diagnosis not present

## 2018-10-17 DIAGNOSIS — J45909 Unspecified asthma, uncomplicated: Secondary | ICD-10-CM | POA: Diagnosis not present

## 2018-10-25 ENCOUNTER — Encounter (INDEPENDENT_AMBULATORY_CARE_PROVIDER_SITE_OTHER): Payer: Self-pay

## 2018-10-25 ENCOUNTER — Ambulatory Visit (INDEPENDENT_AMBULATORY_CARE_PROVIDER_SITE_OTHER): Payer: 59 | Admitting: Family Medicine

## 2018-10-26 ENCOUNTER — Encounter (INDEPENDENT_AMBULATORY_CARE_PROVIDER_SITE_OTHER): Payer: Self-pay | Admitting: Bariatrics

## 2018-10-26 ENCOUNTER — Ambulatory Visit (INDEPENDENT_AMBULATORY_CARE_PROVIDER_SITE_OTHER): Payer: 59 | Admitting: Bariatrics

## 2018-10-26 VITALS — BP 151/82 | HR 53 | Temp 97.6°F | Ht 66.0 in | Wt 268.0 lb

## 2018-10-26 DIAGNOSIS — Z6841 Body Mass Index (BMI) 40.0 and over, adult: Secondary | ICD-10-CM

## 2018-10-26 DIAGNOSIS — E559 Vitamin D deficiency, unspecified: Secondary | ICD-10-CM | POA: Diagnosis not present

## 2018-10-26 DIAGNOSIS — E118 Type 2 diabetes mellitus with unspecified complications: Secondary | ICD-10-CM

## 2018-10-26 DIAGNOSIS — I10 Essential (primary) hypertension: Secondary | ICD-10-CM

## 2018-10-26 DIAGNOSIS — Z9189 Other specified personal risk factors, not elsewhere classified: Secondary | ICD-10-CM

## 2018-10-26 DIAGNOSIS — E66813 Obesity, class 3: Secondary | ICD-10-CM

## 2018-10-26 MED ORDER — VITAMIN D (ERGOCALCIFEROL) 1.25 MG (50000 UNIT) PO CAPS: 50000.0000 [IU] | ORAL_CAPSULE | ORAL | 0 refills | Status: DC

## 2018-10-26 MED ORDER — METFORMIN HCL 500 MG PO TABS: 500.0000 mg | ORAL_TABLET | Freq: Every day | ORAL | 0 refills | Status: DC

## 2018-10-30 MED FILL — MONTELUKAST SOD 10 MG TAB: 10 | 30 days supply | Qty: 30 | Fill #2

## 2018-10-30 NOTE — Progress Notes (Signed)
Office: [phone]  /  Fax: [phone]   HPI:   Chief Complaint: OBESITY Hannah Gibson is here to discuss her progress with her obesity treatment plan. She is on the Category 2 plan +100 calories and 1 oz lean meat at breakfast and is following her eating plan approximately 97 % of the time. She states she is exercising 0 minutes 0 times per week. Hannah Gibson is doing well. She stuck with the plan. The plan was not difficult. Hannah Gibson was hungry occasionally, but not excessive. She denies any unusual cravings. Her weight is 268 lb (121.6 kg) today and has had a weight loss of 13 pounds over a period of 2 weeks since her last visit. She has lost 13 lbs since starting treatment with [name].  Vitamin D deficiency Hannah Gibson has a diagnosis of vitamin D deficiency. Her last vitamin D level was at 31.0. She is not currently taking vit D and denies nausea, vomiting or muscle weakness.  At risk for osteopenia and osteoporosis Hannah Gibson is at higher risk of osteopenia and osteoporosis due to vitamin D deficiency.   Diabetes II Hannah Gibson has a diagnosis of diabetes type II. Hannah Gibson denies any hypoglycemic episodes. Last A1c was at 6.6 and last insulin level was at 11.5 She has been working on intensive lifestyle modifications including diet, exercise, and weight loss to help control her blood glucose levels. Hannah Gibson admits polyphagia.  Hypertension Hannah Gibson is a 59 y.o. female with hypertension. She is not on medications. Hannah Gibson denies chest pain or shortness of breath on exertion. She is working weight loss to help control her blood Gibson with the goal of decreasing her risk of heart attack and stroke. Hannah Gibson is reasonably well controlled.  ASSESSMENT AND PLAN:  Vitamin D deficiency - Plan: Vitamin D, Ergocalciferol, (DRISDOL) 1.25 MG (50000 UT) CAPS capsule  Controlled type 2 diabetes mellitus with complication, without long-term current use of insulin  (HCC) - Plan: metFORMIN (GLUCOPHAGE) 500 MG tablet  Essential hypertension  At risk for osteoporosis  Class 3 severe obesity with serious comorbidity and body mass index (BMI) of 40.0 to 44.9 in adult, unspecified obesity type (Hannah Gibson)  PLAN:  Vitamin D Deficiency Hannah Gibson was informed that low vitamin D levels contributes to fatigue and are associated with obesity, breast, and colon cancer. She agrees to continue to take prescription Vit D @50 ,000 IU every week #4 with no refills and will follow up for routine testing of vitamin D, at least 2-3 times per year. She was informed of the risk of over-replacement of vitamin D and agrees to not increase her dose unless she discusses this with [name] first. Hannah Gibson agrees to follow up as directed.  At risk for osteopenia and osteoporosis Hannah Gibson was given extended  (15 minutes) osteoporosis prevention counseling today. Hannah Gibson is at risk for osteopenia and osteoporosis due to her vitamin D deficiency. She was encouraged to take her vitamin D and follow her higher calcium diet and increase strengthening exercise to help strengthen her bones and decrease her risk of osteopenia and osteoporosis.  Diabetes II Hannah Gibson has been given extensive diabetes education by myself today including ideal fasting and post-prandial blood glucose readings, individual ideal Hgb A1c goals and hypoglycemia prevention. We discussed the importance of good blood sugar control to decrease the likelihood of diabetic complications such as nephropathy, neuropathy, limb loss, blindness, coronary artery disease, and death. We discussed the importance of intensive lifestyle modification including diet, exercise and weight loss as the first line  treatment for diabetes. Ahnesti agrees to start metformin IR 500 mg once daily with breakfast #30 with no refills and follow up at the agreed upon time.  Hypertension We discussed sodium restriction, working on healthy weight loss, and a  regular exercise program as the means to achieve improved blood Gibson control. Hannah Gibson agreed with this plan and agreed to follow up as directed. We will continue to monitor her blood Gibson as well as her progress with the above lifestyle modifications. She will continue her medications as prescribed and will watch for signs of hypotension as she continues her lifestyle modifications.  Obesity Hannah Gibson is currently in the action stage of change. As such, her goal is to continue with weight loss efforts She has agreed to follow the Category 2 plan +100 calories and 1 oz lean meat with breakfast Hannah Gibson has been instructed to work up to a goal of 150 minutes of combined cardio and strengthening exercise per week for weight loss and overall health benefits. We discussed the following Behavioral Modification Strategies today: increase H2O intake, no skipping meals, keeping healthy foods in the home, increasing lean protein intake, decreasing simple carbohydrates, increasing vegetables and work on meal planning and easy cooking plans  Hannah Gibson has agreed to follow up with our clinic in 2 weeks. She was informed of the importance of frequent follow up visits to maximize her success with intensive lifestyle modifications for her multiple health conditions.  ALLERGIES: No Known Allergies  MEDICATIONS: Current Outpatient Medications on File Prior to Visit  Medication Sig Dispense Refill  . budesonide-formoterol (SYMBICORT) 160-4.5 MCG/ACT inhaler Inhale 2 puffs into the lungs 2 (two) times daily. 1 Inhaler 5  . DUEXIS 800-26.6 MG TABS Take 1 tablet by mouth 3 (three) times daily.  6  . montelukast (SINGULAIR) 10 MG tablet Take 1 tablet (10 mg total) by mouth at bedtime. 30 tablet 5  . Multiple Vitamin (MULTIVITAMIN) tablet Take 1 tablet by mouth daily.    . TRAVATAN Z 0.004 % SOLN ophthalmic solution   3  . VENTOLIN HFA 108 (90 Base) MCG/ACT inhaler   0   No current facility-administered  medications on file prior to visit.     PAST MEDICAL HISTORY: Past Medical History:  Diagnosis Date  . Asthma   . Back pain   . HSV infection   . Joint pain   . Lactose intolerance   . Sciatica     PAST SURGICAL HISTORY: Past Surgical History:  Procedure Laterality Date  . MYOMECTOMY  1998  . TOTAL ABDOMINAL HYSTERECTOMY W/ BILATERAL SALPINGOOPHORECTOMY  12/03/1997   secondary to fibroids    SOCIAL HISTORY: Social History   Tobacco Use  . Smoking status: Never Smoker  . Smokeless tobacco: Never Used  Substance Use Topics  . Alcohol use: Yes    Alcohol/week: 5.0 standard drinks    Types: 5 Standard drinks or equivalent per week  . Drug use: No    FAMILY HISTORY: Family History  Problem Relation Age of Onset  . Diabetes Mother   . Hypertension Mother   . Kidney disease Mother   . Glaucoma Mother   . Obesity Mother   . Glaucoma Sister   . Diabetes Maternal Aunt     ROS: Review of Systems  Constitutional: Positive for weight loss.  Respiratory: Negative for shortness of breath (on exertion).   Cardiovascular: Negative for chest pain.  Gastrointestinal: Negative for nausea and vomiting.  Musculoskeletal:       Negative for muscle weakness  Endo/Heme/Allergies:       Positive for polyphagia    PHYSICAL EXAM: Blood Gibson (!) 151/82, pulse (!) 53, temperature 97.6 F (36.4 C), temperature source Oral, height 5\' 6"  (1.676 m), weight 268 lb (121.6 kg), last menstrual period 12/09/1997, SpO2 100 %. Body mass index is 43.26 kg/m. Physical Exam Vitals signs reviewed.  Constitutional:      Appearance: Normal appearance. She is well-developed. She is obese.  Cardiovascular:     Rate and Rhythm: Normal rate.  Pulmonary:     Effort: Pulmonary effort is normal.  Musculoskeletal: Normal range of motion.  Skin:    General: Skin is warm and dry.  Neurological:     Mental Status: She is alert and oriented to person, place, and time.  Psychiatric:        Mood  and Affect: Mood normal.        Behavior: Behavior normal.     RECENT LABS AND TESTS: BMET    Component Value Date/Time   NA 140 10/11/2018 1301   K 4.8 10/11/2018 1301   CL 100 10/11/2018 1301   CO2 21 10/11/2018 1301   GLUCOSE 82 10/11/2018 1301   BUN 15 10/11/2018 1301   CREATININE 0.86 10/11/2018 1301   CALCIUM 9.5 10/11/2018 1301   GFRNONAA 76 10/11/2018 1301   GFRAA 88 10/11/2018 1301   Lab Results  Component Value Date   HGBA1C 6.6 (H) 10/11/2018   Lab Results  Component Value Date   INSULIN 11.5 10/11/2018   CBC    Component Value Date/Time   WBC 5.9 07/28/2018 1549   RBC 4.89 07/28/2018 1549   HGB 14.0 07/28/2018 1549   HGB 14.1 12/19/2012 1603   HCT 43.0 07/28/2018 1549   MCV 88 07/28/2018 1549   MCH 28.6 07/28/2018 1549   MCHC 32.6 07/28/2018 1549   RDW 12.2 (L) 07/28/2018 1549   LYMPHSABS 1.9 07/28/2018 1549   EOSABS 0.5 (H) 07/28/2018 1549   BASOSABS 0.0 07/28/2018 1549   Iron/TIBC/Ferritin/ %Sat No results found for: IRON, TIBC, FERRITIN, IRONPCTSAT Lipid Panel     Component Value Date/Time   CHOL 181 10/11/2018 1301   TRIG 46 10/11/2018 1301   HDL 49 10/11/2018 1301   CHOLHDL 3.7 10/11/2018 1301   LDLCALC 123 (H) 10/11/2018 1301   Hepatic Function Panel     Component Value Date/Time   PROT 6.9 10/11/2018 1301   ALBUMIN 4.1 10/11/2018 1301   AST 19 10/11/2018 1301   ALT 22 10/11/2018 1301   ALKPHOS 110 10/11/2018 1301   BILITOT 0.2 10/11/2018 1301      Component Value Date/Time   TSH 1.880 10/11/2018 1301     Ref. Range 10/11/2018 13:01  Vitamin D, 25-Hydroxy Latest Ref Range: 30.0 - 100.0 ng/mL 31.0     OBESITY BEHAVIORAL INTERVENTION VISIT  Today's visit was # 2   Starting weight: 281 lbs Starting date: 10/11/2018 Today's weight : 268 lbs Today's date: 10/26/2018 Total lbs lost to date: 72   ASK: We discussed the diagnosis of obesity with Hannah Gibson today and Lorey agreed to give [name] permission to discuss  obesity behavioral modification therapy today.  ASSESS: Shiya has the diagnosis of obesity and her BMI today is 43.28 Nizhoni is in the action stage of change   ADVISE: Jeslin was educated on the multiple health risks of obesity as well as the benefit of weight loss to improve her health. She was advised of the need for long term treatment and the  importance of lifestyle modifications to improve her current health and to decrease her risk of future health problems.  AGREE: Multiple dietary modification options and treatment options were discussed and  Hannah Gibson agreed to follow the recommendations documented in the above note.  ARRANGE: Daisi was educated on the importance of frequent visits to treat obesity as outlined per CMS and USPSTF guidelines and agreed to schedule her next follow up appointment today.  Corey Skains, am acting as Location manager for [employer]. Owens Shark, DO  I have reviewed the above documentation for accuracy and completeness, and I agree with the above. -Jearld Lesch, DO

## 2018-10-31 DIAGNOSIS — E1159 Type 2 diabetes mellitus with other circulatory complications: Secondary | ICD-10-CM | POA: Insufficient documentation

## 2018-10-31 DIAGNOSIS — I10 Essential (primary) hypertension: Secondary | ICD-10-CM | POA: Insufficient documentation

## 2018-10-31 DIAGNOSIS — I152 Hypertension secondary to endocrine disorders: Secondary | ICD-10-CM | POA: Insufficient documentation

## 2018-10-31 DIAGNOSIS — E119 Type 2 diabetes mellitus without complications: Secondary | ICD-10-CM | POA: Insufficient documentation

## 2018-10-31 DIAGNOSIS — E118 Type 2 diabetes mellitus with unspecified complications: Secondary | ICD-10-CM

## 2018-10-31 DIAGNOSIS — Z6841 Body Mass Index (BMI) 40.0 and over, adult: Secondary | ICD-10-CM

## 2018-11-08 MED FILL — SYMBICORT 160-4.5 MCG INH: 160-4.5 | 30 days supply | Qty: 10 | Fill #1

## 2018-11-08 MED FILL — MONTELUKAST SOD 10 MG TAB: 10 | 30 days supply | Qty: 30 | Fill #2

## 2018-11-10 ENCOUNTER — Ambulatory Visit (INDEPENDENT_AMBULATORY_CARE_PROVIDER_SITE_OTHER): Payer: 59 | Admitting: Allergy

## 2018-11-10 ENCOUNTER — Encounter: Payer: Self-pay | Admitting: Allergy

## 2018-11-10 VITALS — BP 114/74 | HR 62 | Resp 18 | Ht 65.1 in | Wt 273.5 lb

## 2018-11-10 DIAGNOSIS — J4550 Severe persistent asthma, uncomplicated: Secondary | ICD-10-CM | POA: Diagnosis not present

## 2018-11-10 DIAGNOSIS — J310 Chronic rhinitis: Secondary | ICD-10-CM | POA: Diagnosis not present

## 2018-11-10 NOTE — Patient Instructions (Addendum)
Asthma - improved!  Lung function testing is normal today! - adult onset - have access to albuterol inhaler 2 puffs every 4-6 hours as needed for cough/wheeze/shortness of breath/chest tightness.  May use 15-20 minutes prior to activity.   Monitor frequency of use.   - continue singulair 10mg  daily - take at bedtime - continue symbicort 168mcg 1 puff twice a day.   If you are not meeting below goal then increase back to 2 puffs twice a day  Asthma control goals:   Full participation in all desired activities (may need albuterol before activity)  Albuterol use two time or less a week on average (not counting use with activity)  Cough interfering with sleep two time or less a month  Oral steroids no more than once a year  No hospitalizations  Nasal congestion, non-allergic - environmental allergy testing was negative on both skin testing and serum IgE testing - continue use of Xhance nasal spray device.  Use 2 sprays each nostril twice a day as needed.  This spray is Fluticasone however the device allows for deeper deposition of the medication into the sinuses for better improvement.  - recommend use of saline nasal rinse prior to nasal spray use to help clean out the nose and keep nose moisturized   Follow-up in 4 months or sooner if needed

## 2018-11-10 NOTE — Progress Notes (Signed)
Follow-up Note  RE: Hannah Gibson MRN: [id number] DOB: 1961-06-14 Date of Office Visit: 11/10/2018   History of present illness: Hannah Gibson is a 58 y.o. female presenting today for follow-up of asthma and rhinitis.  She was last seen in the office on 07/28/18 by Hannah Gibson for initial evaluation.  I started her on singulair and symbicort after last visit.  She states since being on these medications she has not needed to use her albuterol.  She states she started on using the symbicort 2 puffs twice a day for about 2 months after last visit.  She states her symptoms were controlled and thus she decreased down to 1 puff twice a days feels just as well controlled.  She denies nighttime awakenings.  She has not had an ED/UC visit or oral steroid needs since last visit.  With her rhinitis she has been using the Hannah Gibson but as needed.  She does feel Hannah Gibson is more effective than standard flonase.    Review of systems: Review of Systems  Constitutional: Negative for chills, fever and malaise/fatigue.  HENT: Negative for congestion, ear discharge, nosebleeds and sore throat.   Eyes: Negative for pain, discharge and redness.  Respiratory: Negative for cough, shortness of breath and wheezing.   Cardiovascular: Negative for chest pain.  Gastrointestinal: Negative for abdominal pain, constipation, diarrhea, heartburn, nausea and vomiting.  Musculoskeletal: Negative for joint pain.  Skin: Negative for itching and rash.  Neurological: Negative for headaches.    All other systems negative unless noted above in HPI  Past medical/social/surgical/family history have been reviewed and are unchanged unless specifically indicated below.  No changes  Medication List: Allergies as of 11/10/2018   No Known Allergies     Medication List       Accurate as of November 10, 2018  5:03 PM. Always use your most recent med list.        budesonide-formoterol 160-4.5 MCG/ACT inhaler Commonly known  as:  SYMBICORT Inhale 2 puffs into the lungs 2 (two) times daily.   DUEXIS 800-26.6 MG Tabs Generic drug:  Ibuprofen-Famotidine Take 1 tablet by mouth 3 (three) times daily.   metFORMIN 500 MG tablet Commonly known as:  GLUCOPHAGE Take 1 tablet (500 mg total) by mouth daily with breakfast.   montelukast 10 MG tablet Commonly known as:  SINGULAIR Take 1 tablet (10 mg total) by mouth at bedtime.   multivitamin tablet Take 1 tablet by mouth daily.   TRAVATAN Z 0.004 % Soln ophthalmic solution Generic drug:  Travoprost (BAK Free)   Vitamin D (Ergocalciferol) 1.25 MG (50000 UT) Caps capsule Commonly known as:  DRISDOL Take 1 capsule (50,000 Units total) by mouth every 7 (seven) days.       Known medication allergies: No Known Allergies   Physical examination: Blood pressure 114/74, pulse 62, resp. rate 18, height 5' 5.1" (1.654 m), weight 273 lb 8 oz (124.1 kg), last menstrual period 12/09/1997, SpO2 97 %.  General: Alert, interactive, in no acute distress. HEENT: PERRLA, TMs pearly gray, turbinates minimally edematous without discharge, post-pharynx non erythematous. Neck: Supple without lymphadenopathy. Lungs: Clear to auscultation without wheezing, rhonchi or rales. {no increased work of breathing. CV: Normal S1, S2 without murmurs. Abdomen: Nondistended, nontender. Skin: Warm and dry, without lesions or rashes. Extremities:  No clubbing, cyanosis or edema. Neuro:   Grossly intact.  Diagnositics/Labs: Labs:  Component     Latest Ref Rng & Units 07/28/2018  IgE (Immunoglobulin E), Serum  6 - 495 IU/mL 1,070 (H)  D Pteronyssinus IgE     Class 0 kU/L <0.10  D Farinae IgE     Class 0 kU/L <0.10  Cat Dander IgE     Class 0 kU/L <0.10  Dog Dander IgE     Class 0 kU/L <0.10  [place] Grass IgE     Class 0 kU/L <0.10  Timothy Grass IgE     Class 0 kU/L <0.10  [place] Grass IgE     Class 0 kU/L <0.10  Cockroach, German IgE     Class 0 kU/L <0.10  Penicillium  Chrysogen IgE     Class 0 kU/L <0.10  Cladosporium Herbarum IgE     Class 0 kU/L <0.10  Aspergillus Fumigatus IgE     Class 0 kU/L <0.10  Alternaria Alternata IgE     Class 0 kU/L <0.10  Maple/Box Elder IgE     Class 0 kU/L <0.10  Common Silver Wendee Copp IgE     Class 0 kU/L <0.10  Cedar, [place] IgE     Class 0 kU/L <0.10  Oak, White IgE     Class 0 kU/L <0.10  Elm, American IgE     Class 0 kU/L <0.10  Cottonwood IgE     Class 0 kU/L <0.10  Pecan, Hickory IgE     Class 0 kU/L <0.10  White Mulberry IgE     Class 0 kU/L <0.10  Ragweed, Short IgE     Class 0 kU/L <0.10  Pigweed, Rough IgE     Class 0 kU/L <0.10  Sheep Sorrel IgE Qn     Class 0 kU/L <0.10  Mouse Urine IgE     Class 0 kU/L <0.10   Component     Latest Ref Rng & Units 07/28/2018  WBC     3.4 - 10.8 x10E3/uL 5.9  RBC     3.77 - 5.28 x10E6/uL 4.89  Hemoglobin     11.1 - 15.9 g/dL 14.0  HCT     34.0 - 46.6 % 43.0  MCV     79 - 97 fL 88  MCH     26.6 - 33.0 pg 28.6  MCHC     31.5 - 35.7 g/dL 32.6  RDW     12.3 - 15.4 % 12.2 (L)  Neutrophils     Not Estab. % 50  Lymphs     Not Estab. % 33  Monocytes     Not Estab. % 7  Eos     Not Estab. % 9  Basos     Not Estab. % 1  NEUT#     1.4 - 7.0 x10E3/uL 3.0  Lymphocyte #     0.7 - 3.1 x10E3/uL 1.9  Monocytes Absolute     0.1 - 0.9 x10E3/uL 0.4  EOS (ABSOLUTE)     0.0 - 0.4 x10E3/uL 0.5 (H)  Basophils Absolute     0.0 - 0.2 x10E3/uL 0.0  Immature Granulocytes     Not Estab. % 0  Immature Grans (Abs)     0.0 - 0.1 x10E3/uL 0.0   Spirometry: FEV1: 2.47L 107%, FVC: 2.71L 94%, ratio consistent with nonobstructive pattern   Assessment and plan:   Asthma, severe persistent - improved!  Lung function testing is normal today! - adult onset - have access to albuterol inhaler 2 puffs every 4-6 hours as needed for cough/wheeze/shortness of breath/chest tightness.  May use 15-20 minutes prior to activity.   Monitor frequency of use.   -  continue  singulair 10mg  daily - take at bedtime - continue symbicort 159mcg 1 puff twice a day.   If you are not meeting below goal then increase back to 2 puffs twice a day  Asthma control goals:   Full participation in all desired activities (may need albuterol before activity)  Albuterol use two time or less a week on average (not counting use with activity)  Cough interfering with sleep two time or less a month  Oral steroids no more than once a year  No hospitalizations  Non-allergic rhinitis - environmental allergy testing was negative on both skin testing and serum IgE testing - continue use of Xhance nasal spray device.  Use 2 sprays each nostril twice a day as needed.  This spray is Fluticasone however the device allows for deeper deposition of the medication into the sinuses for better improvement.  - recommend use of saline nasal rinse prior to nasal spray use to help clean out the nose and keep nose moisturized  Follow-up in 4 months or sooner if needed  I appreciate the opportunity to take part in Angelli's care. Please do not hesitate to contact me with questions.  Sincerely,   Hannah Feeler, MD Allergy/Immunology [facility]

## 2018-11-13 ENCOUNTER — Ambulatory Visit (INDEPENDENT_AMBULATORY_CARE_PROVIDER_SITE_OTHER): Payer: 59 | Admitting: Bariatrics

## 2018-11-13 ENCOUNTER — Encounter (INDEPENDENT_AMBULATORY_CARE_PROVIDER_SITE_OTHER): Payer: Self-pay | Admitting: Bariatrics

## 2018-11-13 VITALS — BP 128/73 | HR 60 | Temp 98.0°F | Ht 65.0 in | Wt 266.0 lb

## 2018-11-13 DIAGNOSIS — Z6841 Body Mass Index (BMI) 40.0 and over, adult: Secondary | ICD-10-CM

## 2018-11-13 DIAGNOSIS — E559 Vitamin D deficiency, unspecified: Secondary | ICD-10-CM | POA: Diagnosis not present

## 2018-11-13 DIAGNOSIS — E119 Type 2 diabetes mellitus without complications: Secondary | ICD-10-CM | POA: Diagnosis not present

## 2018-11-13 DIAGNOSIS — I10 Essential (primary) hypertension: Secondary | ICD-10-CM

## 2018-11-14 DIAGNOSIS — E559 Vitamin D deficiency, unspecified: Secondary | ICD-10-CM | POA: Insufficient documentation

## 2018-11-14 NOTE — Progress Notes (Signed)
Office: [phone]  /  Fax: [phone]   HPI:   Chief Complaint: OBESITY Hannah Gibson is here to discuss her progress with her obesity treatment plan. She is on the Category 2 plan +100 calories and 1 ounce lean meat for breakfast and she is following her eating plan approximately 100 % of the time. She states she is exercising 0 minutes 0 times per week. Hannah Gibson is doing well overall. She denies any struggles. Her hunger is controlled and she denies any significant cravings. Her weight is 266 lb (120.7 kg) today and has had a weight loss of 2 pounds over a period of 2 weeks since her last visit. She has lost 15 lbs since starting treatment with [name].  Diabetes II Hannah Gibson has a diagnosis of diabetes type II. She is taking metformin. Taje does not check blood sugars. Last A1c was at 6.6 She has been working on intensive lifestyle modifications including diet, exercise, and weight loss to help control her blood glucose levels.  Hypertension Hannah Gibson is a 58 y.o. female with hypertension. She is not on medications. Hannah Gibson denies chest pain or shortness of breath on exertion. She is working weight loss to help control her blood pressure with the goal of decreasing her risk of heart attack and stroke. Hannah Gibson blood pressure is well controlled.  Vitamin D deficiency Hannah Gibson has a diagnosis of vitamin D deficiency. She is currently taking high dose vit D and denies nausea, vomiting or muscle weakness.  ASSESSMENT AND PLAN:  Type 2 diabetes mellitus without complication, without long-term current use of insulin (HCC)  Essential hypertension  Vitamin D deficiency  Class 3 severe obesity with serious comorbidity and body mass index (BMI) of 40.0 to 44.9 in adult, unspecified obesity type ([place])  PLAN:  Diabetes II Hannah Gibson has been given extensive diabetes education by myself today including ideal fasting and post-prandial blood glucose readings, individual  ideal Hgb A1c goals and hypoglycemia prevention. We discussed the importance of good blood sugar control to decrease the likelihood of diabetic complications such as nephropathy, neuropathy, limb loss, blindness, coronary artery disease, and death. We discussed the importance of intensive lifestyle modification including diet, exercise and weight loss as the first line treatment for diabetes. She will continue decreasing simple carbohydrates. Hannah Gibson will continue metformin and will follow up at the agreed upon time.  Hypertension We discussed sodium restriction, working on healthy weight loss, gradually increasing exercise as the means to achieve improved blood pressure control. Hannah Gibson agreed with this plan and agreed to follow up as directed. We will continue to monitor her blood pressure as well as her progress with the above lifestyle modifications. She will continue her medications as prescribed and will watch for signs of hypotension as she continues her lifestyle modifications.  Vitamin D Deficiency Hannah Gibson was informed that low vitamin D levels contributes to fatigue and are associated with obesity, breast, and colon cancer. She agrees to continue to take prescription Vit D @50 ,000 IU every week and will follow up for routine testing of vitamin D, at least 2-3 times per year. She was informed of the risk of over-replacement of vitamin D and agrees to not increase her dose unless she discusses this with [name] first.  I spent > than 50% of the 15 minute visit on counseling as documented in the note.  Obesity Hannah Gibson is currently in the action stage of change. As such, her goal is to continue with weight loss efforts She has agreed to follow  the Category 2 plan Hannah Gibson will consider going back to aerobic classes for weight loss and overall health benefits. We discussed the following Behavioral Modification Strategies today: increase H2O intake, keeping healthy foods in the home, better  snacking choices, increasing lean protein intake, decreasing simple carbohydrates, increasing vegetables and work on meal planning and easy cooking plans Additional lunch options were provided to patient today  Hannah Gibson has agreed to follow up with our clinic in 2 weeks. She was informed of the importance of frequent follow up visits to maximize her success with intensive lifestyle modifications for her multiple health conditions.  ALLERGIES: No Known Allergies  MEDICATIONS: Current Outpatient Medications on File Prior to Visit  Medication Sig Dispense Refill  . budesonide-formoterol (SYMBICORT) 160-4.5 MCG/ACT inhaler Inhale 2 puffs into the lungs 2 (two) times daily. 1 Inhaler 5  . DUEXIS 800-26.6 MG TABS Take 1 tablet by mouth 3 (three) times daily.  6  . metFORMIN (GLUCOPHAGE) 500 MG tablet Take 1 tablet (500 mg total) by mouth daily with breakfast. 30 tablet 0  . montelukast (SINGULAIR) 10 MG tablet Take 1 tablet (10 mg total) by mouth at bedtime. 30 tablet 5  . Multiple Vitamin (MULTIVITAMIN) tablet Take 1 tablet by mouth daily.    . TRAVATAN Z 0.004 % SOLN ophthalmic solution   3  . Vitamin D, Ergocalciferol, (DRISDOL) 1.25 MG (50000 UT) CAPS capsule Take 1 capsule (50,000 Units total) by mouth every 7 (seven) days. 4 capsule 0   No current facility-administered medications on file prior to visit.     PAST MEDICAL HISTORY: Past Medical History:  Diagnosis Date  . Asthma   . Back pain   . HSV infection   . Joint pain   . Lactose intolerance   . Sciatica     PAST SURGICAL HISTORY: Past Surgical History:  Procedure Laterality Date  . MYOMECTOMY  1998  . TOTAL ABDOMINAL HYSTERECTOMY W/ BILATERAL SALPINGOOPHORECTOMY  12/03/1997   secondary to fibroids    SOCIAL HISTORY: Social History   Tobacco Use  . Smoking status: Never Smoker  . Smokeless tobacco: Never Used  Substance Use Topics  . Alcohol use: Yes    Alcohol/week: 5.0 standard drinks    Types: 5 Standard  drinks or equivalent per week  . Drug use: No    FAMILY HISTORY: Family History  Problem Relation Age of Onset  . Diabetes Mother   . Hypertension Mother   . Kidney disease Mother   . Glaucoma Mother   . Obesity Mother   . Glaucoma Sister   . Diabetes Maternal Aunt   . Allergic rhinitis Neg Hx   . Asthma Neg Hx   . Eczema Neg Hx   . Urticaria Neg Hx     ROS: Review of Systems  Constitutional: Positive for weight loss.  Respiratory: Negative for shortness of breath (on exertion).   Cardiovascular: Negative for chest pain.  Gastrointestinal: Negative for nausea and vomiting.  Musculoskeletal:       Negative for muscle weakness    PHYSICAL EXAM: Blood pressure 128/73, pulse 60, temperature 98 F (36.7 C), temperature source Oral, height 5\' 5"  (1.651 m), weight 266 lb (120.7 kg), last menstrual period 12/09/1997, SpO2 97 %. Body mass index is 44.26 kg/m. Physical Exam Vitals signs reviewed.  Constitutional:      Appearance: Normal appearance. She is well-developed. She is obese.  Cardiovascular:     Rate and Rhythm: Normal rate.  Pulmonary:     Effort: Pulmonary effort  is normal.  Musculoskeletal: Normal range of motion.  Skin:    General: Skin is warm and dry.  Neurological:     Mental Status: She is alert and oriented to person, place, and time.  Psychiatric:        Mood and Affect: Mood normal.        Behavior: Behavior normal.     RECENT LABS AND TESTS: BMET    Component Value Date/Time   NA 140 10/11/2018 1301   K 4.8 10/11/2018 1301   CL 100 10/11/2018 1301   CO2 21 10/11/2018 1301   GLUCOSE 82 10/11/2018 1301   BUN 15 10/11/2018 1301   CREATININE 0.86 10/11/2018 1301   CALCIUM 9.5 10/11/2018 1301   GFRNONAA 76 10/11/2018 1301   GFRAA 88 10/11/2018 1301   Lab Results  Component Value Date   HGBA1C 6.6 (H) 10/11/2018   Lab Results  Component Value Date   INSULIN 11.5 10/11/2018   CBC    Component Value Date/Time   WBC 5.9 07/28/2018  1549   RBC 4.89 07/28/2018 1549   HGB 14.0 07/28/2018 1549   HGB 14.1 12/19/2012 1603   HCT 43.0 07/28/2018 1549   MCV 88 07/28/2018 1549   MCH 28.6 07/28/2018 1549   MCHC 32.6 07/28/2018 1549   RDW 12.2 (L) 07/28/2018 1549   LYMPHSABS 1.9 07/28/2018 1549   EOSABS 0.5 (H) 07/28/2018 1549   BASOSABS 0.0 07/28/2018 1549   Iron/TIBC/Ferritin/ %Sat No results found for: IRON, TIBC, FERRITIN, IRONPCTSAT Lipid Panel     Component Value Date/Time   CHOL 181 10/11/2018 1301   TRIG 46 10/11/2018 1301   HDL 49 10/11/2018 1301   CHOLHDL 3.7 10/11/2018 1301   LDLCALC 123 (H) 10/11/2018 1301   Hepatic Function Panel     Component Value Date/Time   PROT 6.9 10/11/2018 1301   ALBUMIN 4.1 10/11/2018 1301   AST 19 10/11/2018 1301   ALT 22 10/11/2018 1301   ALKPHOS 110 10/11/2018 1301   BILITOT 0.2 10/11/2018 1301      Component Value Date/Time   TSH 1.880 10/11/2018 1301     Ref. Range 10/11/2018 13:01  Vitamin D, 25-Hydroxy Latest Ref Range: 30.0 - 100.0 ng/mL 31.0     OBESITY BEHAVIORAL INTERVENTION VISIT  Today's visit was # 3   Starting weight: 281 lbs Starting date: 10/11/2018 Today's weight : 266 lbs Today's date: 11/13/2018 Total lbs lost to date: 15   ASK: We discussed the diagnosis of obesity with Hannah Gibson today and Louan agreed to give [name] permission to discuss obesity behavioral modification therapy today.  ASSESS: Aliesha has the diagnosis of obesity and her BMI today is 44.26 Nasiyah is in the action stage of change   ADVISE: Channell was educated on the multiple health risks of obesity as well as the benefit of weight loss to improve her health. She was advised of the need for long term treatment and the importance of lifestyle modifications to improve her current health and to decrease her risk of future health problems.  AGREE: Multiple dietary modification options and treatment options were discussed and  Zakari agreed to follow the  recommendations documented in the above note.  ARRANGE: Darielys was educated on the importance of frequent visits to treat obesity as outlined per CMS and USPSTF guidelines and agreed to schedule her next follow up appointment today.  Corey Skains, am acting as Location manager for [employer]. Owens Shark, DO  I have reviewed the above documentation for  accuracy and completeness, and I agree with the above. -Jearld Lesch, DO

## 2018-11-15 ENCOUNTER — Other Ambulatory Visit (INDEPENDENT_AMBULATORY_CARE_PROVIDER_SITE_OTHER): Payer: Self-pay | Admitting: Bariatrics

## 2018-11-15 DIAGNOSIS — E559 Vitamin D deficiency, unspecified: Secondary | ICD-10-CM

## 2018-11-17 ENCOUNTER — Other Ambulatory Visit (INDEPENDENT_AMBULATORY_CARE_PROVIDER_SITE_OTHER): Payer: Self-pay | Admitting: Bariatrics

## 2018-11-17 DIAGNOSIS — E118 Type 2 diabetes mellitus with unspecified complications: Secondary | ICD-10-CM

## 2018-11-20 ENCOUNTER — Telehealth (INDEPENDENT_AMBULATORY_CARE_PROVIDER_SITE_OTHER): Payer: Self-pay | Admitting: Bariatrics

## 2018-11-20 NOTE — Telephone Encounter (Signed)
Patient called stated she does not need refill on Metformin. She will need refill on Vitamin that Dr. Owens Shark prescribed.

## 2018-11-21 DIAGNOSIS — H401131 Primary open-angle glaucoma, bilateral, mild stage: Secondary | ICD-10-CM | POA: Diagnosis not present

## 2018-11-21 DIAGNOSIS — H524 Presbyopia: Secondary | ICD-10-CM | POA: Diagnosis not present

## 2018-11-21 DIAGNOSIS — Z7984 Long term (current) use of oral hypoglycemic drugs: Secondary | ICD-10-CM | POA: Diagnosis not present

## 2018-11-21 DIAGNOSIS — E119 Type 2 diabetes mellitus without complications: Secondary | ICD-10-CM | POA: Diagnosis not present

## 2018-11-21 DIAGNOSIS — H52223 Regular astigmatism, bilateral: Secondary | ICD-10-CM | POA: Diagnosis not present

## 2018-11-21 DIAGNOSIS — H5202 Hypermetropia, left eye: Secondary | ICD-10-CM | POA: Diagnosis not present

## 2018-11-24 MED FILL — TRAVOPROST (BAK FREE) 0.004: 0.004 | 25 days supply | Qty: 3 | Fill #3

## 2018-11-29 ENCOUNTER — Encounter (INDEPENDENT_AMBULATORY_CARE_PROVIDER_SITE_OTHER): Payer: Self-pay | Admitting: Bariatrics

## 2018-11-29 ENCOUNTER — Ambulatory Visit (INDEPENDENT_AMBULATORY_CARE_PROVIDER_SITE_OTHER): Payer: 59 | Admitting: Bariatrics

## 2018-11-29 VITALS — BP 119/76 | HR 51 | Temp 97.6°F | Ht 65.0 in | Wt 262.0 lb

## 2018-11-29 DIAGNOSIS — Z9189 Other specified personal risk factors, not elsewhere classified: Secondary | ICD-10-CM

## 2018-11-29 DIAGNOSIS — E559 Vitamin D deficiency, unspecified: Secondary | ICD-10-CM

## 2018-11-29 DIAGNOSIS — Z6841 Body Mass Index (BMI) 40.0 and over, adult: Secondary | ICD-10-CM

## 2018-11-29 DIAGNOSIS — E118 Type 2 diabetes mellitus with unspecified complications: Secondary | ICD-10-CM | POA: Diagnosis not present

## 2018-11-29 MED ORDER — VITAMIN D (ERGOCALCIFEROL) 1.25 MG (50000 UNIT) PO CAPS: 50000.0000 [IU] | ORAL_CAPSULE | ORAL | 0 refills | Status: DC

## 2018-11-29 MED ORDER — METFORMIN HCL 500 MG PO TABS: 500.0000 mg | ORAL_TABLET | Freq: Two times a day (BID) | ORAL | 0 refills | Status: DC

## 2018-11-30 ENCOUNTER — Other Ambulatory Visit (INDEPENDENT_AMBULATORY_CARE_PROVIDER_SITE_OTHER): Payer: Self-pay

## 2018-11-30 DIAGNOSIS — E118 Type 2 diabetes mellitus with unspecified complications: Secondary | ICD-10-CM

## 2018-11-30 DIAGNOSIS — E559 Vitamin D deficiency, unspecified: Secondary | ICD-10-CM

## 2018-11-30 MED ORDER — VITAMIN D (ERGOCALCIFEROL) 1.25 MG (50000 UNIT) PO CAPS: 50000.0000 [IU] | ORAL_CAPSULE | ORAL | 0 refills | Status: DC

## 2018-11-30 MED ORDER — METFORMIN HCL 500 MG PO TABS: 500.0000 mg | ORAL_TABLET | Freq: Two times a day (BID) | ORAL | 0 refills | Status: DC

## 2018-11-30 MED FILL — metFORMIN HCL 500 MG TABS: 500 | 30 days supply | Qty: 60 | Fill #0

## 2018-12-04 NOTE — Progress Notes (Signed)
Office: [phone]  /  Fax: [phone]   HPI:   Chief Complaint: OBESITY Hannah Gibson is here to discuss her progress with her obesity treatment plan. She is on the Category 2 plan and is following her eating plan approximately 96 % of the time. She states she is exercising 0 minutes 0 times per week. Hannah Gibson is doing well overall. She is doing well with her cravings. She did have a craving, but it was controlled.  Her weight is 262 lb (118.8 kg) today and has had a weight loss of 2 pounds over a period of 2 weeks since her last visit. She has lost 17 lbs since starting treatment with [name].  Vitamin D deficiency Hannah Gibson has a diagnosis of vitamin D deficiency. She is currently taking vit D and denies nausea, vomiting, or muscle weakness.  At risk for osteopenia and osteoporosis Hannah Gibson is at higher risk of osteopenia and osteoporosis due to vitamin D deficiency.   Diabetes II Hannah Gibson has a diagnosis of diabetes type II. Hannah Gibson's last A1c was 6.6 and Insulin was 11.5 on 10/11/18. She is taking metformin and denies polyphagia. She has been working on intensive lifestyle modifications including diet, exercise, and weight loss to help control her blood glucose levels.  ASSESSMENT AND PLAN:  Vitamin D deficiency - Plan: DISCONTINUED: Vitamin D, Ergocalciferol, (DRISDOL) 1.25 MG (50000 UT) CAPS capsule  Controlled type 2 diabetes mellitus with complication, without long-term current use of insulin (HCC) - Plan: DISCONTINUED: metFORMIN (GLUCOPHAGE) 500 MG tablet  At risk for osteoporosis  Class 3 severe obesity with serious comorbidity and body mass index (BMI) of 40.0 to 44.9 in adult, unspecified obesity type ([place])  PLAN:  Vitamin D Deficiency Hannah Gibson was informed that low vitamin D levels contributes to fatigue and are associated with obesity, breast, and colon cancer. Hannah Gibson agrees to continue to take prescription Vit D @50 ,000 IU every week #4 with no refills and will  follow up for routine testing of vitamin D, at least 2-3 times per year. She was informed of the risk of over-replacement of vitamin D and agrees to not increase her dose unless she discusses this with [name] first. Hannah Gibson agrees to follow up in 2 weeks as directed.  At risk for osteopenia and osteoporosis Hannah Gibson was given extended (15 minutes) osteoporosis prevention counseling today. Hannah Gibson is at risk for osteopenia and osteoporosis due to her vitamin D deficiency. She was encouraged to take her vitamin D and follow her higher calcium diet and increase strengthening exercise to help strengthen her bones and decrease her risk of osteopenia and osteoporosis.  Diabetes II Hannah Gibson has been given extensive diabetes education by myself today including ideal fasting and post-prandial blood glucose readings, individual ideal Hgb A1c goals, and hypoglycemia prevention. We discussed the importance of good blood sugar control to decrease the likelihood of diabetic complications such as nephropathy, neuropathy, limb loss, blindness, coronary artery disease, and death. We discussed the importance of intensive lifestyle modification including diet, exercise and weight loss as the first line treatment for diabetes. Hannah Gibson agrees to continue her metformin 500mg , 1 PO BID #60 with no refills and she will follow up at the agreed upon time.  Obesity Hannah Gibson is currently in the action stage of change. As such, her goal is to continue with weight loss efforts. She has agreed to follow the Category 2 plan and she will move her protein around during the day. She was given Charity fundraiser. Hannah Gibson has been instructed to  work up to a goal of 150 minutes of combined cardio and strengthening exercise per week for weight loss and overall health benefits. We discussed the following Behavioral Modification Strategies today: increasing lean protein intake, decreasing simple carbohydrates,  increasing vegetables, increase H2O intake, decrease eating out, no skipping meals, keeping healthy foods in the home, and work on meal planning and easy cooking plans.  Hannah Gibson has agreed to follow up with our clinic in 2 weeks. She was informed of the importance of frequent follow up visits to maximize her success with intensive lifestyle modifications for her multiple health conditions.  ALLERGIES: No Known Allergies  MEDICATIONS: Current Outpatient Medications on File Prior to Visit  Medication Sig Dispense Refill  . budesonide-formoterol (SYMBICORT) 160-4.5 MCG/ACT inhaler Inhale 2 puffs into the lungs 2 (two) times daily. 1 Inhaler 5  . DUEXIS 800-26.6 MG TABS Take 1 tablet by mouth 3 (three) times daily.  6  . montelukast (SINGULAIR) 10 MG tablet Take 1 tablet (10 mg total) by mouth at bedtime. 30 tablet 5  . Multiple Vitamin (MULTIVITAMIN) tablet Take 1 tablet by mouth daily.    . TRAVATAN Z 0.004 % SOLN ophthalmic solution   3   No current facility-administered medications on file prior to visit.     PAST MEDICAL HISTORY: Past Medical History:  Diagnosis Date  . Asthma   . Back pain   . HSV infection   . Joint pain   . Lactose intolerance   . Sciatica     PAST SURGICAL HISTORY: Past Surgical History:  Procedure Laterality Date  . MYOMECTOMY  1998  . TOTAL ABDOMINAL HYSTERECTOMY W/ BILATERAL SALPINGOOPHORECTOMY  12/03/1997   secondary to fibroids    SOCIAL HISTORY: Social History   Tobacco Use  . Smoking status: Never Smoker  . Smokeless tobacco: Never Used  Substance Use Topics  . Alcohol use: Yes    Alcohol/week: 5.0 standard drinks    Types: 5 Standard drinks or equivalent per week  . Drug use: No    FAMILY HISTORY: Family History  Problem Relation Age of Onset  . Diabetes Mother   . Hypertension Mother   . Kidney disease Mother   . Glaucoma Mother   . Obesity Mother   . Glaucoma Sister   . Diabetes Maternal Aunt   . Allergic rhinitis Neg Hx     . Asthma Neg Hx   . Eczema Neg Hx   . Urticaria Neg Hx     ROS: Review of Systems  Constitutional: Positive for weight loss.  Gastrointestinal: Negative for nausea and vomiting.  Musculoskeletal:       Negative for muscle weakness.  Endo/Heme/Allergies:       Negative for polyphagia.    PHYSICAL EXAM: Blood pressure 119/76, pulse (!) 51, temperature 97.6 F (36.4 C), temperature source Oral, height 5\' 5"  (1.651 m), weight 262 lb (118.8 kg), last menstrual period 12/09/1997, SpO2 99 %. Body mass index is 43.6 kg/m. Physical Exam Vitals signs reviewed.  Constitutional:      Appearance: Normal appearance. She is obese.  Cardiovascular:     Rate and Rhythm: Normal rate.  Pulmonary:     Effort: Pulmonary effort is normal.  Musculoskeletal: Normal range of motion.  Skin:    General: Skin is warm and dry.  Neurological:     Mental Status: She is alert and oriented to person, place, and time.  Psychiatric:        Mood and Affect: Mood normal.  Behavior: Behavior normal.     RECENT LABS AND TESTS: BMET    Component Value Date/Time   NA 140 10/11/2018 1301   K 4.8 10/11/2018 1301   CL 100 10/11/2018 1301   CO2 21 10/11/2018 1301   GLUCOSE 82 10/11/2018 1301   BUN 15 10/11/2018 1301   CREATININE 0.86 10/11/2018 1301   CALCIUM 9.5 10/11/2018 1301   GFRNONAA 76 10/11/2018 1301   GFRAA 88 10/11/2018 1301   Lab Results  Component Value Date   HGBA1C 6.6 (H) 10/11/2018   Lab Results  Component Value Date   INSULIN 11.5 10/11/2018   CBC    Component Value Date/Time   WBC 5.9 07/28/2018 1549   RBC 4.89 07/28/2018 1549   HGB 14.0 07/28/2018 1549   HGB 14.1 12/19/2012 1603   HCT 43.0 07/28/2018 1549   MCV 88 07/28/2018 1549   MCH 28.6 07/28/2018 1549   MCHC 32.6 07/28/2018 1549   RDW 12.2 (L) 07/28/2018 1549   LYMPHSABS 1.9 07/28/2018 1549   EOSABS 0.5 (H) 07/28/2018 1549   BASOSABS 0.0 07/28/2018 1549   Iron/TIBC/Ferritin/ %Sat No results found for:  IRON, TIBC, FERRITIN, IRONPCTSAT Lipid Panel     Component Value Date/Time   CHOL 181 10/11/2018 1301   TRIG 46 10/11/2018 1301   HDL 49 10/11/2018 1301   CHOLHDL 3.7 10/11/2018 1301   LDLCALC 123 (H) 10/11/2018 1301   Hepatic Function Panel     Component Value Date/Time   PROT 6.9 10/11/2018 1301   ALBUMIN 4.1 10/11/2018 1301   AST 19 10/11/2018 1301   ALT 22 10/11/2018 1301   ALKPHOS 110 10/11/2018 1301   BILITOT 0.2 10/11/2018 1301      Component Value Date/Time   TSH 1.880 10/11/2018 1301   Results for JALEYA, PEBLEY A (MRN [id number]) as of 12/04/2018 11:38  Ref. Range 10/11/2018 13:01  Vitamin D, 25-Hydroxy Latest Ref Range: 30.0 - 100.0 ng/mL 31.0    OBESITY BEHAVIORAL INTERVENTION VISIT  Today's visit was # 4   Starting weight: 281 lbs  Starting date: 10/11/18 Today's weight : Weight: 262 lb (118.8 kg)  Today's date: 11/29/2018 Total lbs lost to date: 17    11/29/2018  Height 5\' 5"  (1.651 m)  Weight 262 lb (118.8 kg)  BMI (Calculated) 43.6  BLOOD PRESSURE - SYSTOLIC 326  BLOOD PRESSURE - DIASTOLIC 76   Body Fat % 7124 %  Total Body Water (lbs) 85.8 lbs   ASK: We discussed the diagnosis of obesity with [name] today and Rosielee agreed to give [name] permission to discuss obesity behavioral modification therapy today.  ASSESS: Melizza has the diagnosis of obesity and her BMI today is 43.6. Chantrice is in the action stage of change.   ADVISE: Yoshie was educated on the multiple health risks of obesity as well as the benefit of weight loss to improve her health. She was advised of the need for long term treatment and the importance of lifestyle modifications to improve her current health and to decrease her risk of future health problems.  AGREE: Multiple dietary modification options and treatment options were discussed and Tabor agreed to follow the recommendations documented in the above note.  ARRANGE: Mathilde was educated on the  importance of frequent visits to treat obesity as outlined per CMS and USPSTF guidelines and agreed to schedule her next follow up appointment today.  I, Marcille Blanco, CMA, am acting as Location manager for [employer]. Owens Shark, DO  I have reviewed the above  documentation for accuracy and completeness, and I agree with the above. -Jearld Lesch, DO

## 2018-12-15 MED FILL — MONTELUKAST SOD 10 MG TAB: 10 | 30 days supply | Qty: 30 | Fill #3

## 2018-12-20 ENCOUNTER — Encounter (INDEPENDENT_AMBULATORY_CARE_PROVIDER_SITE_OTHER): Payer: Self-pay

## 2018-12-20 ENCOUNTER — Ambulatory Visit (INDEPENDENT_AMBULATORY_CARE_PROVIDER_SITE_OTHER): Payer: 59 | Admitting: Bariatrics

## 2018-12-21 ENCOUNTER — Encounter (INDEPENDENT_AMBULATORY_CARE_PROVIDER_SITE_OTHER): Payer: Self-pay

## 2018-12-25 MED FILL — TRAVOPROST (BAK FREE) 0.004: 0.004 | 25 days supply | Qty: 3 | Fill #0

## 2019-01-12 MED FILL — VALACYCLOVIR HCL 500 MG TAB: 500 | 10 days supply | Qty: 30 | Fill #0

## 2019-01-25 ENCOUNTER — Ambulatory Visit (INDEPENDENT_AMBULATORY_CARE_PROVIDER_SITE_OTHER): Payer: 59 | Admitting: Bariatrics

## 2019-01-25 ENCOUNTER — Encounter (INDEPENDENT_AMBULATORY_CARE_PROVIDER_SITE_OTHER): Payer: Self-pay | Admitting: Bariatrics

## 2019-01-25 ENCOUNTER — Other Ambulatory Visit: Payer: Self-pay

## 2019-01-25 DIAGNOSIS — I10 Essential (primary) hypertension: Secondary | ICD-10-CM | POA: Diagnosis not present

## 2019-01-25 DIAGNOSIS — E559 Vitamin D deficiency, unspecified: Secondary | ICD-10-CM

## 2019-01-25 DIAGNOSIS — E118 Type 2 diabetes mellitus with unspecified complications: Secondary | ICD-10-CM | POA: Diagnosis not present

## 2019-01-25 DIAGNOSIS — Z6841 Body Mass Index (BMI) 40.0 and over, adult: Secondary | ICD-10-CM

## 2019-01-25 DIAGNOSIS — E66813 Obesity, class 3: Secondary | ICD-10-CM

## 2019-01-25 MED FILL — MONTELUKAST SOD 10 MG TAB: 10 | 30 days supply | Qty: 30 | Fill #4

## 2019-01-29 ENCOUNTER — Other Ambulatory Visit (INDEPENDENT_AMBULATORY_CARE_PROVIDER_SITE_OTHER): Payer: Self-pay | Admitting: Bariatrics

## 2019-01-29 DIAGNOSIS — E118 Type 2 diabetes mellitus with unspecified complications: Secondary | ICD-10-CM

## 2019-01-29 MED FILL — VIT D2 1.25 MG (50,000 UNIT: 1.25 MG | 28 days supply | Qty: 4 | Fill #0

## 2019-01-29 NOTE — Progress Notes (Signed)
Office: [phone]  /  Fax: [phone] TeleHealth Visit:  Hannah Gibson has verbally consented to this TeleHealth visit today. The patient is located at home, the provider is located at the [employer] [facility] office. The participants in this visit include the listed provider and patient and any and all parties involved. The visit was conducted today via telephone. Hannah Gibson was unable to use realtime audiovisual technology today and the telehealth visit was conducted via telephone.  HPI:   Chief Complaint: OBESITY Hannah Gibson is here to discuss her progress with her obesity treatment plan. She is on the Category 2 plan and is following her eating plan approximately 80 % of the time. She states she is exercising 0 minutes 0 times per week. Hannah Gibson states that she is unsure if she has lost weight or not (weight 266 lbs on 4/30). She was last seen on 11/29/18. She is working for one day a week. She is not stress eating. [name] is skipping some meals. We were unable to weigh the patient today for this TeleHealth visit. She has gained 4 pounds since her last visit. She has lost 15 lbs since starting treatment with [name].  Hannah Gibson is a 58 y.o. female with Hannah. Her blood pressures are 159/91 and 139/83 and is not well controlled. Hannah Gibson denies chest pain or shortness of breath on exertion. She is working weight loss to help control her blood pressure with the goal of decreasing her risk of heart attack and stroke.   Diabetes II controlled Hannah Gibson has a diagnosis of diabetes type II. Hannah Gibson does not check her blood sugar and she denies polyphagia. Last A1c was at 6.6 and her last insulin level was at 11.5 She has been working on intensive lifestyle modifications including diet, exercise, and weight loss to help control her blood glucose levels.  Vitamin D deficiency Hannah Gibson has a diagnosis of vitamin D deficiency. She is currently taking  vit D and denies nausea, vomiting or muscle weakness.  ASSESSMENT AND PLAN:  Essential Hannah  Controlled type 2 diabetes mellitus with complication, without long-term current use of insulin (Hannah Gibson) - Plan: metFORMIN (GLUCOPHAGE) 500 MG tablet  Vitamin D deficiency - Plan: Vitamin D, Ergocalciferol, (DRISDOL) 1.25 MG (50000 UT) CAPS capsule  Class 3 severe obesity with serious comorbidity and body mass index (BMI) of 40.0 to 44.9 in adult, unspecified obesity type ([name])  PLAN:  Hannah We discussed sodium restriction, working on healthy weight loss, and a regular exercise program as the means to achieve improved blood pressure control. Hannah Gibson agreed with this plan and agreed to follow up as directed. We will continue to monitor her blood pressure as well as her progress with the above lifestyle modifications. She will continue her medications as prescribed and she will continue to check her blood pressure. Hannah Gibson will watch for signs of hypotension as she continues her lifestyle modifications.  Diabetes II Hannah Gibson has been given extensive diabetes education by myself today including ideal fasting and post-prandial blood glucose readings, individual ideal Hgb A1c goals and hypoglycemia prevention. We discussed the importance of good blood sugar control to decrease the likelihood of diabetic complications such as nephropathy, neuropathy, limb loss, blindness, coronary artery disease, and death. We discussed the importance of intensive lifestyle modification including diet, exercise and weight loss as the first line treatment for diabetes. Hannah Gibson agrees to continue metformin 500 mg BID #60 with no refills and follow up at the agreed upon time.  Vitamin D  Deficiency Hannah Gibson was informed that low vitamin D levels contributes to fatigue and are associated with obesity, breast, and colon cancer. She agrees to continue to take prescription Vit D @50 ,000 IU every week #4 with no  refills and will follow up for routine testing of vitamin D, at least 2-3 times per year. She was informed of the risk of over-replacement of vitamin D and agrees to not increase her dose unless she discusses this with [name] first. Hannah Gibson agrees to follow up as directed.  Obesity Hannah Gibson is currently in the action stage of change. As such, her goal is to continue with weight loss efforts She has agreed to follow the Category 2 plan Hannah Gibson has been instructed to work up to a goal of 150 minutes of combined cardio and strengthening exercise per week or she will begin walking for weight loss and overall health benefits. We discussed the following Behavioral Modification Strategies today: planning for success, increase H2O intake, no skipping meals, keeping healthy foods in the home, better snacking choices, increasing lean protein intake, decreasing simple carbohydrates, increasing vegetables, decrease eating out, work on meal planning and easy cooking plans, emotional eating strategies and ways to avoid night time snacking Hannah Gibson will weigh herself at home before each visit and record. She will work on getting her vegetables.  Hannah Gibson has agreed to follow up with our clinic in 2 weeks. She was informed of the importance of frequent follow up visits to maximize her success with intensive lifestyle modifications for her multiple health conditions.  ALLERGIES: No Known Allergies  MEDICATIONS: Current Outpatient Medications on File Prior to Visit  Medication Sig Dispense Refill  . budesonide-formoterol (SYMBICORT) 160-4.5 MCG/ACT inhaler Inhale 2 puffs into the lungs 2 (two) times daily. 1 Inhaler 5  . DUEXIS 800-26.6 MG TABS Take 1 tablet by mouth 3 (three) times daily.  6  . metFORMIN (GLUCOPHAGE) 500 MG tablet Take 1 tablet (500 mg total) by mouth 2 (two) times daily. 60 tablet 0  . montelukast (SINGULAIR) 10 MG tablet Take 1 tablet (10 mg total) by mouth at bedtime. 30 tablet 5  .  Multiple Vitamin (MULTIVITAMIN) tablet Take 1 tablet by mouth daily.    . TRAVATAN Z 0.004 % SOLN ophthalmic solution   3  . Vitamin D, Ergocalciferol, (DRISDOL) 1.25 MG (50000 UT) CAPS capsule Take 1 capsule (50,000 Units total) by mouth every 7 (seven) days. 4 capsule 0   No current facility-administered medications on file prior to visit.     PAST MEDICAL HISTORY: Past Medical History:  Diagnosis Date  . Asthma   . Back pain   . HSV infection   . Joint pain   . Lactose intolerance   . Sciatica     PAST SURGICAL HISTORY: Past Surgical History:  Procedure Laterality Date  . MYOMECTOMY  1998  . TOTAL ABDOMINAL HYSTERECTOMY W/ BILATERAL SALPINGOOPHORECTOMY  12/03/1997   secondary to fibroids    SOCIAL HISTORY: Social History   Tobacco Use  . Smoking status: Never Smoker  . Smokeless tobacco: Never Used  Substance Use Topics  . Alcohol use: Yes    Alcohol/week: 5.0 standard drinks    Types: 5 Standard drinks or equivalent per week  . Drug use: No    FAMILY HISTORY: Family History  Problem Relation Age of Onset  . Diabetes Mother   . Hannah Mother   . Kidney disease Mother   . Glaucoma Mother   . Obesity Mother   . Glaucoma Sister   .  Diabetes Maternal Aunt   . Allergic rhinitis Neg Hx   . Asthma Neg Hx   . Eczema Neg Hx   . Urticaria Neg Hx     ROS: Review of Systems  Constitutional: Negative for weight loss.  Respiratory: Negative for shortness of breath (on exertion).   Cardiovascular: Negative for chest pain.  Gastrointestinal: Negative for nausea and vomiting.  Musculoskeletal:       Negative for muscle weakness    PHYSICAL EXAM: Pt in no acute distress  RECENT LABS AND TESTS: BMET    Component Value Date/Time   NA 140 10/11/2018 1301   K 4.8 10/11/2018 1301   CL 100 10/11/2018 1301   CO2 21 10/11/2018 1301   GLUCOSE 82 10/11/2018 1301   BUN 15 10/11/2018 1301   CREATININE 0.86 10/11/2018 1301   CALCIUM 9.5 10/11/2018 1301    GFRNONAA 76 10/11/2018 1301   GFRAA 88 10/11/2018 1301   Lab Results  Component Value Date   HGBA1C 6.6 (H) 10/11/2018   Lab Results  Component Value Date   INSULIN 11.5 10/11/2018   CBC    Component Value Date/Time   WBC 5.9 07/28/2018 1549   RBC 4.89 07/28/2018 1549   HGB 14.0 07/28/2018 1549   HGB 14.1 12/19/2012 1603   HCT 43.0 07/28/2018 1549   MCV 88 07/28/2018 1549   MCH 28.6 07/28/2018 1549   MCHC 32.6 07/28/2018 1549   RDW 12.2 (L) 07/28/2018 1549   LYMPHSABS 1.9 07/28/2018 1549   EOSABS 0.5 (H) 07/28/2018 1549   BASOSABS 0.0 07/28/2018 1549   Iron/TIBC/Ferritin/ %Sat No results found for: IRON, TIBC, FERRITIN, IRONPCTSAT Lipid Panel     Component Value Date/Time   CHOL 181 10/11/2018 1301   TRIG 46 10/11/2018 1301   HDL 49 10/11/2018 1301   CHOLHDL 3.7 10/11/2018 1301   LDLCALC 123 (H) 10/11/2018 1301   Hepatic Function Panel     Component Value Date/Time   PROT 6.9 10/11/2018 1301   ALBUMIN 4.1 10/11/2018 1301   AST 19 10/11/2018 1301   ALT 22 10/11/2018 1301   ALKPHOS 110 10/11/2018 1301   BILITOT 0.2 10/11/2018 1301      Component Value Date/Time   TSH 1.880 10/11/2018 1301     Ref. Range 10/11/2018 13:01  Vitamin D, 25-Hydroxy Latest Ref Range: 30.0 - 100.0 ng/mL 31.0    I, Doreene Nest, am acting as Location manager for [employer]. Owens Shark, DO  I have reviewed the above documentation for accuracy and completeness, and I agree with the above. -Jearld Lesch, DO

## 2019-01-30 MED ORDER — METFORMIN HCL 500 MG PO TABS: 500.0000 mg | ORAL_TABLET | Freq: Two times a day (BID) | ORAL | 0 refills | Status: DC

## 2019-01-30 MED ORDER — VITAMIN D (ERGOCALCIFEROL) 1.25 MG (50000 UNIT) PO CAPS: 50000.0000 [IU] | ORAL_CAPSULE | ORAL | 0 refills | Status: DC

## 2019-01-30 MED FILL — metFORMIN HCL 500 MG TABS: 500 | 30 days supply | Qty: 60 | Fill #0

## 2019-02-02 MED FILL — MONTELUKAST SOD 10 MG TAB: 10 | 30 days supply | Qty: 30 | Fill #4

## 2019-02-06 ENCOUNTER — Ambulatory Visit (INDEPENDENT_AMBULATORY_CARE_PROVIDER_SITE_OTHER): Payer: 59 | Admitting: Bariatrics

## 2019-02-06 ENCOUNTER — Other Ambulatory Visit: Payer: Self-pay

## 2019-02-06 DIAGNOSIS — E119 Type 2 diabetes mellitus without complications: Secondary | ICD-10-CM | POA: Diagnosis not present

## 2019-02-06 DIAGNOSIS — Z6841 Body Mass Index (BMI) 40.0 and over, adult: Secondary | ICD-10-CM | POA: Diagnosis not present

## 2019-02-06 DIAGNOSIS — H401131 Primary open-angle glaucoma, bilateral, mild stage: Secondary | ICD-10-CM | POA: Diagnosis not present

## 2019-02-06 DIAGNOSIS — I10 Essential (primary) hypertension: Secondary | ICD-10-CM

## 2019-02-06 MED ORDER — CHLORTHALIDONE 25 MG PO TABS: 25.0000 mg | ORAL_TABLET | Freq: Every day | ORAL | 0 refills | Status: DC

## 2019-02-06 MED FILL — CHLORTHALIDONE 25 MG TABLET: 25 | 30 days supply | Qty: 30 | Fill #0

## 2019-02-07 NOTE — Progress Notes (Signed)
Office: [phone]  /  Fax: [phone] TeleHealth Visit:  Hannah Gibson has verbally consented to this TeleHealth visit today. The patient is located at home, the provider is located at the [employer] [facility] office. The participants in this visit include the listed provider and patient and any and all parties involved. The visit was conducted today via telephone. [name] was unable to use realtime audiovisual technology today and the telehealth visit was conducted via telephone.  HPI:   Chief Complaint: OBESITY Hannah Gibson is here to discuss her progress with her obesity treatment plan. She is on the Category 2 plan and is following her eating plan approximately 80 % of the time. She states she is exercising 0 minutes 0 times per week. Ceyda states that she has gained 4 pounds (weight 270 lbs) We were unable to weigh the patient today for this TeleHealth visit. She feels as if she has gained weight since her last visit. She has lost 11 lbs since starting treatment with [name].  Hypertension Hannah Gibson is a 57 y.o. female with hypertension. She states she has not been on any medications (not on medications). Her blood pressure readings were 180/89 and 150/90 today. Occasionally her blood pressure is elevated at 150/90, but typically is well controlled. Hannah Gibson denies chest pain or shortness of breath on exertion. She is working weight loss to help control her blood pressure with the goal of decreasing her risk of heart attack and stroke.   Diabetes II Hannah Gibson has a diagnosis of diabetes type II. She is taking Glucophage (increased cravings). Her diabetes is controlled. Hannah Gibson  denies any hypoglycemic episodes. Last A1c was at 6.6 She has been working on intensive lifestyle modifications including diet, exercise, and weight loss to help control her blood glucose levels.  ASSESSMENT AND PLAN:  Essential hypertension - Plan: chlorthalidone (HYGROTON) 25 MG  tablet  Type 2 diabetes mellitus without complication, without long-term current use of insulin ([facility])  Class 3 severe obesity with serious comorbidity and body mass index (BMI) of 40.0 to 44.9 in adult, unspecified obesity type (Hannah Gibson)  PLAN:  Hypertension We discussed sodium restriction, working on healthy weight loss, and a regular exercise program as the means to achieve improved blood pressure control. [name] agreed with this plan and agreed to follow up as directed. We will continue to monitor her blood pressure as well as her progress with the above lifestyle modifications. [name] agrees to start Chlorthalidone 25 mg once daily #30 with no refills and she will watch for signs of hypotension as she continues her lifestyle modifications.  Diabetes II Hannah Gibson has been given extensive diabetes education by myself today including ideal fasting and post-prandial blood glucose readings, individual ideal Hgb A1c goals and hypoglycemia prevention. We discussed the importance of good blood sugar control to decrease the likelihood of diabetic complications such as nephropathy, neuropathy, limb loss, blindness, coronary artery disease, and death. We discussed the importance of intensive lifestyle modification including diet, exercise and weight loss as the first line treatment for diabetes. [name] agrees to continue glucophage and follow up at the agreed upon time.  Obesity Hannah Gibson is currently in the action stage of change. As such, her goal is to continue with weight loss efforts She has agreed to follow the Category 2 plan Hannah Gibson will consider exercise for weight loss and overall health benefits. We discussed the following Behavioral Modification Strategies today: planning for success, increase H2O intake, no skipping meals, keeping healthy foods in the  home, increasing lean protein intake, decreasing simple carbohydrates, increasing vegetables, decrease eating out and work on meal  planning and easy cooking plans Hannah Gibson will weigh herself at home before each visit.  Hannah Gibson has agreed to follow up with our clinic in 2 weeks. She was informed of the importance of frequent follow up visits to maximize her success with intensive lifestyle modifications for her multiple health conditions.  ALLERGIES: No Known Allergies  MEDICATIONS: Current Outpatient Medications on File Prior to Visit  Medication Sig Dispense Refill  . budesonide-formoterol (SYMBICORT) 160-4.5 MCG/ACT inhaler Inhale 2 puffs into the lungs 2 (two) times daily. 1 Inhaler 5  . DUEXIS 800-26.6 MG TABS Take 1 tablet by mouth 3 (three) times daily.  6  . metFORMIN (GLUCOPHAGE) 500 MG tablet Take 1 tablet (500 mg total) by mouth 2 (two) times daily. 60 tablet 0  . montelukast (SINGULAIR) 10 MG tablet Take 1 tablet (10 mg total) by mouth at bedtime. 30 tablet 5  . Multiple Vitamin (MULTIVITAMIN) tablet Take 1 tablet by mouth daily.    . TRAVATAN Z 0.004 % SOLN ophthalmic solution   3  . Vitamin D, Ergocalciferol, (DRISDOL) 1.25 MG (50000 UT) CAPS capsule Take 1 capsule (50,000 Units total) by mouth every 7 (seven) days. 4 capsule 0   No current facility-administered medications on file prior to visit.     PAST MEDICAL HISTORY: Past Medical History:  Diagnosis Date  . Asthma   . Back pain   . HSV infection   . Joint pain   . Lactose intolerance   . Sciatica     PAST SURGICAL HISTORY: Past Surgical History:  Procedure Laterality Date  . MYOMECTOMY  1998  . TOTAL ABDOMINAL HYSTERECTOMY W/ BILATERAL SALPINGOOPHORECTOMY  12/03/1997   secondary to fibroids    SOCIAL HISTORY: Social History   Tobacco Use  . Smoking status: Never Smoker  . Smokeless tobacco: Never Used  Substance Use Topics  . Alcohol use: Yes    Alcohol/week: 5.0 standard drinks    Types: 5 Standard drinks or equivalent per week  . Drug use: No    FAMILY HISTORY: Family History  Problem Relation Age of Onset  .  Diabetes Mother   . Hypertension Mother   . Kidney disease Mother   . Glaucoma Mother   . Obesity Mother   . Glaucoma Sister   . Diabetes Maternal Aunt   . Allergic rhinitis Neg Hx   . Asthma Neg Hx   . Eczema Neg Hx   . Urticaria Neg Hx     ROS: Review of Systems  Constitutional: Negative for weight loss.  Respiratory: Negative for shortness of breath (on exertion).   Cardiovascular: Negative for chest pain.  Endo/Heme/Allergies:       Negative for hypoglycemia    PHYSICAL EXAM: Pt in no acute distress  RECENT LABS AND TESTS: BMET    Component Value Date/Time   NA 140 10/11/2018 1301   K 4.8 10/11/2018 1301   CL 100 10/11/2018 1301   CO2 21 10/11/2018 1301   GLUCOSE 82 10/11/2018 1301   BUN 15 10/11/2018 1301   CREATININE 0.86 10/11/2018 1301   CALCIUM 9.5 10/11/2018 1301   GFRNONAA 76 10/11/2018 1301   GFRAA 88 10/11/2018 1301   Lab Results  Component Value Date   HGBA1C 6.6 (H) 10/11/2018   Lab Results  Component Value Date   INSULIN 11.5 10/11/2018   CBC    Component Value Date/Time   WBC 5.9 07/28/2018  1549   RBC 4.89 07/28/2018 1549   HGB 14.0 07/28/2018 1549   HGB 14.1 12/19/2012 1603   HCT 43.0 07/28/2018 1549   MCV 88 07/28/2018 1549   MCH 28.6 07/28/2018 1549   MCHC 32.6 07/28/2018 1549   RDW 12.2 (L) 07/28/2018 1549   LYMPHSABS 1.9 07/28/2018 1549   EOSABS 0.5 (H) 07/28/2018 1549   BASOSABS 0.0 07/28/2018 1549   Iron/TIBC/Ferritin/ %Sat No results found for: IRON, TIBC, FERRITIN, IRONPCTSAT Lipid Panel     Component Value Date/Time   CHOL 181 10/11/2018 1301   TRIG 46 10/11/2018 1301   HDL 49 10/11/2018 1301   CHOLHDL 3.7 10/11/2018 1301   LDLCALC 123 (H) 10/11/2018 1301   Hepatic Function Panel     Component Value Date/Time   PROT 6.9 10/11/2018 1301   ALBUMIN 4.1 10/11/2018 1301   AST 19 10/11/2018 1301   ALT 22 10/11/2018 1301   ALKPHOS 110 10/11/2018 1301   BILITOT 0.2 10/11/2018 1301      Component Value Date/Time    TSH 1.880 10/11/2018 1301     Ref. Range 10/11/2018 13:01  Vitamin D, 25-Hydroxy Latest Ref Range: 30.0 - 100.0 ng/mL 31.0    I, Doreene Nest, am acting as Location manager for [employer]. Owens Shark, DO  I have reviewed the above documentation for accuracy and completeness, and I agree with the above. -Jearld Lesch, DO

## 2019-02-08 ENCOUNTER — Ambulatory Visit (INDEPENDENT_AMBULATORY_CARE_PROVIDER_SITE_OTHER): Payer: Self-pay | Admitting: Bariatrics

## 2019-02-08 ENCOUNTER — Encounter (INDEPENDENT_AMBULATORY_CARE_PROVIDER_SITE_OTHER): Payer: Self-pay | Admitting: Bariatrics

## 2019-02-13 MED FILL — SYMBICORT 160-4.5 MCG INH: 160-4.5 | 30 days supply | Qty: 10 | Fill #2

## 2019-02-13 MED FILL — TRAVOPROST (BAK FREE) 0.004: 0.004 | 25 days supply | Qty: 3 | Fill #1

## 2019-02-21 ENCOUNTER — Ambulatory Visit (INDEPENDENT_AMBULATORY_CARE_PROVIDER_SITE_OTHER): Payer: 59 | Admitting: Bariatrics

## 2019-02-21 ENCOUNTER — Other Ambulatory Visit: Payer: Self-pay

## 2019-02-21 ENCOUNTER — Encounter (INDEPENDENT_AMBULATORY_CARE_PROVIDER_SITE_OTHER): Payer: Self-pay | Admitting: Bariatrics

## 2019-02-21 DIAGNOSIS — Z6841 Body Mass Index (BMI) 40.0 and over, adult: Secondary | ICD-10-CM | POA: Diagnosis not present

## 2019-02-21 DIAGNOSIS — I10 Essential (primary) hypertension: Secondary | ICD-10-CM | POA: Diagnosis not present

## 2019-02-21 DIAGNOSIS — E119 Type 2 diabetes mellitus without complications: Secondary | ICD-10-CM

## 2019-02-21 DIAGNOSIS — E559 Vitamin D deficiency, unspecified: Secondary | ICD-10-CM

## 2019-02-21 MED ORDER — VITAMIN D (ERGOCALCIFEROL) 1.25 MG (50000 UNIT) PO CAPS: 50000.0000 [IU] | ORAL_CAPSULE | ORAL | 0 refills | Status: DC

## 2019-02-21 MED FILL — VIT D2 1.25 MG (50,000 UNIT: 1.25 MG | 28 days supply | Qty: 4 | Fill #0

## 2019-02-21 NOTE — Progress Notes (Signed)
Office: [phone]  /  Fax: [phone] TeleHealth Visit:  Hannah Gibson has verbally consented to this TeleHealth visit today. The patient is located at home, the provider is located at the [employer] [facility] office. The participants in this visit include the listed provider and patient. Hannah Gibson was unable to use realtime audiovisual technology today and the telehealth visit was conducted via telephone.  HPI:   Chief Complaint: OBESITY Hannah Gibson is here to discuss her progress with her obesity treatment plan. She is on the Category 2 plan and is following her eating plan approximately 96 % of the time. She states she is exercising 0 minutes 0 times per week. Hannah Gibson states that she has lost weight. Her weight this morning was 263 pounds. She has been sticking well to the plan and she is doing well with water.  We were unable to weigh the patient today for this TeleHealth visit. She feels as if she has lost weight since her last visit. She has lost 17 lbs since starting treatment with [name].  Hypertension Hannah Gibson is a 57 y.o. female with hypertension. Hannah Gibson's blood pressure is not currently well controlled as she is not taking chlorthalidone. Her readings recently were 147/95 and 136/96. She is working on weight loss to help control her blood pressure with the goal of decreasing her risk of heart attack and stroke. Hannah Gibson denies chest pain or significant stress.  Diabetes II Hannah Gibson has a diagnosis of diabetes type II. Hannah Gibson is taking metformin and her last A1c was 6.6 on 10/11/18. She has been working on intensive lifestyle modifications including diet, exercise, and weight loss to help control her blood glucose levels.  Vitamin D Deficiency Hannah Gibson has a diagnosis of vitamin D deficiency. She is currently on vit D. Hannah Gibson denies nausea, vomiting, or muscle weakness.  ASSESSMENT AND PLAN:  Essential hypertension  Type 2 diabetes mellitus without  complication, without long-term current use of insulin (HCC)  Vitamin D deficiency - Plan: Vitamin D, Ergocalciferol, (DRISDOL) 1.25 MG (50000 UT) CAPS capsule  Class 3 severe obesity with serious comorbidity and body mass index (BMI) of 40.0 to 44.9 in adult, unspecified obesity type (HCC)  PLAN:  Hypertension We discussed sodium restriction, working on healthy weight loss, and a regular exercise program as the means to achieve improved blood pressure control. We will continue to monitor her blood pressure as well as her progress with the above lifestyle modifications. She will continue her medications as prescribed and increasing H2O intake. She will watch for signs of hypotension as she continues her lifestyle modifications. Hannah Gibson agreed with this plan and agreed to follow up as directed in 2 weeks.  Diabetes II Hannah Gibson has been given extensive diabetes education by myself today including ideal fasting and post-prandial blood glucose readings, individual ideal Hgb A1c goals and hypoglycemia prevention. We discussed the importance of good blood sugar control to decrease the likelihood of diabetic complications such as nephropathy, neuropathy, limb loss, blindness, coronary artery disease, and death. We discussed the importance of intensive lifestyle modification including diet, exercise and weight loss as the first line treatment for diabetes. Hannah Gibson agrees to continue her diabetes medications and will follow up at the agreed upon time.  Vitamin D Deficiency Hannah Gibson was informed that low vitamin D levels contribute to fatigue and are associated with obesity, breast, and colon cancer. Hannah Gibson agrees to continue to take prescription Vit D @50 ,000 IU every week #4 with no refills and will follow up for routine testing  of vitamin D, at least 2-3 times per year. She was informed of the risk of over-replacement of vitamin D and agrees to not increase her dose unless she discusses this with [name]  first. Hannah Gibson agrees to follow up in 2 weeks as directed.  Obesity Hannah Gibson is currently in the action stage of change. As such, her goal is to continue with weight loss efforts. She has agreed to follow the Category 2 plan. Hannah Gibson has been instructed to work up to a goal of 150 minutes of combined cardio and strengthening exercise per week for weight loss and overall health benefits. We discussed the following Behavioral Modification Strategies today: increasing lean protein intake, decreasing simple carbohydrates, increasing vegetables, increase H2O intake, no skipping meals, decrease eating out, keeping healthy foods in the home, and work on meal planning and easy cooking plans. Hannah Gibson will continue to weigh herself at home until she can return to the office and to continue meal planning.  Hannah Gibson has agreed to follow up with our clinic in 2 weeks. She was informed of the importance of frequent follow up visits to maximize her success with intensive lifestyle modifications for her multiple health conditions.  ALLERGIES: No Known Allergies  MEDICATIONS: Current Outpatient Medications on File Prior to Visit  Medication Sig Dispense Refill  . budesonide-formoterol (SYMBICORT) 160-4.5 MCG/ACT inhaler Inhale 2 puffs into the lungs 2 (two) times daily. 1 Inhaler 5  . chlorthalidone (HYGROTON) 25 MG tablet Take 1 tablet (25 mg total) by mouth daily. 30 tablet 0  . DUEXIS 800-26.6 MG TABS Take 1 tablet by mouth 3 (three) times daily.  6  . metFORMIN (GLUCOPHAGE) 500 MG tablet Take 1 tablet (500 mg total) by mouth 2 (two) times daily. 60 tablet 0  . montelukast (SINGULAIR) 10 MG tablet Take 1 tablet (10 mg total) by mouth at bedtime. 30 tablet 5  . Multiple Vitamin (MULTIVITAMIN) tablet Take 1 tablet by mouth daily.    . TRAVATAN Z 0.004 % SOLN ophthalmic solution   3   No current facility-administered medications on file prior to visit.     PAST MEDICAL HISTORY: Past Medical  History:  Diagnosis Date  . Asthma   . Back pain   . HSV infection   . Joint pain   . Lactose intolerance   . Sciatica     PAST SURGICAL HISTORY: Past Surgical History:  Procedure Laterality Date  . MYOMECTOMY  1998  . TOTAL ABDOMINAL HYSTERECTOMY W/ BILATERAL SALPINGOOPHORECTOMY  12/03/1997   secondary to fibroids    SOCIAL HISTORY: Social History   Tobacco Use  . Smoking status: Never Smoker  . Smokeless tobacco: Never Used  Substance Use Topics  . Alcohol use: Yes    Alcohol/week: 5.0 standard drinks    Types: 5 Standard drinks or equivalent per week  . Drug use: No    FAMILY HISTORY: Family History  Problem Relation Age of Onset  . Diabetes Mother   . Hypertension Mother   . Kidney disease Mother   . Glaucoma Mother   . Obesity Mother   . Glaucoma Sister   . Diabetes Maternal Aunt   . Allergic rhinitis Neg Hx   . Asthma Neg Hx   . Eczema Neg Hx   . Urticaria Neg Hx     ROS: Review of Systems  Cardiovascular: Negative for chest pain.  Gastrointestinal: Negative for nausea and vomiting.  Musculoskeletal:       Negative for muscle weakness.    PHYSICAL EXAM: Pt  in no acute distress  RECENT LABS AND TESTS: BMET    Component Value Date/Time   NA 140 10/11/2018 1301   K 4.8 10/11/2018 1301   CL 100 10/11/2018 1301   CO2 21 10/11/2018 1301   GLUCOSE 82 10/11/2018 1301   BUN 15 10/11/2018 1301   CREATININE 0.86 10/11/2018 1301   CALCIUM 9.5 10/11/2018 1301   GFRNONAA 76 10/11/2018 1301   GFRAA 88 10/11/2018 1301   Lab Results  Component Value Date   HGBA1C 6.6 (H) 10/11/2018   Lab Results  Component Value Date   INSULIN 11.5 10/11/2018   CBC    Component Value Date/Time   WBC 5.9 07/28/2018 1549   RBC 4.89 07/28/2018 1549   HGB 14.0 07/28/2018 1549   HGB 14.1 12/19/2012 1603   HCT 43.0 07/28/2018 1549   MCV 88 07/28/2018 1549   MCH 28.6 07/28/2018 1549   MCHC 32.6 07/28/2018 1549   RDW 12.2 (L) 07/28/2018 1549   LYMPHSABS 1.9  07/28/2018 1549   EOSABS 0.5 (H) 07/28/2018 1549   BASOSABS 0.0 07/28/2018 1549   Iron/TIBC/Ferritin/ %Sat No results found for: IRON, TIBC, FERRITIN, IRONPCTSAT Lipid Panel     Component Value Date/Time   CHOL 181 10/11/2018 1301   TRIG 46 10/11/2018 1301   HDL 49 10/11/2018 1301   CHOLHDL 3.7 10/11/2018 1301   LDLCALC 123 (H) 10/11/2018 1301   Hepatic Function Panel     Component Value Date/Time   PROT 6.9 10/11/2018 1301   ALBUMIN 4.1 10/11/2018 1301   AST 19 10/11/2018 1301   ALT 22 10/11/2018 1301   ALKPHOS 110 10/11/2018 1301   BILITOT 0.2 10/11/2018 1301      Component Value Date/Time   TSH 1.880 10/11/2018 1301   Results for KENSLEY, VALLADARES A (MRN [id number]) as of 02/21/2019 15:51  Ref. Range 10/11/2018 13:01  Vitamin D, 25-Hydroxy Latest Ref Range: 30.0 - 100.0 ng/mL 31.0     I, Marcille Blanco, CMA, am acting as Location manager for [employer]. Owens Shark, DO  I have reviewed the above documentation for accuracy and completeness, and I agree with the above. -Jearld Lesch, DO

## 2019-03-05 ENCOUNTER — Ambulatory Visit (INDEPENDENT_AMBULATORY_CARE_PROVIDER_SITE_OTHER): Payer: 59 | Admitting: Bariatrics

## 2019-03-07 ENCOUNTER — Encounter (INDEPENDENT_AMBULATORY_CARE_PROVIDER_SITE_OTHER): Payer: Self-pay | Admitting: Bariatrics

## 2019-03-07 ENCOUNTER — Ambulatory Visit (INDEPENDENT_AMBULATORY_CARE_PROVIDER_SITE_OTHER): Payer: 59 | Admitting: Bariatrics

## 2019-03-07 ENCOUNTER — Other Ambulatory Visit: Payer: Self-pay

## 2019-03-07 DIAGNOSIS — I10 Essential (primary) hypertension: Secondary | ICD-10-CM

## 2019-03-07 DIAGNOSIS — E559 Vitamin D deficiency, unspecified: Secondary | ICD-10-CM | POA: Diagnosis not present

## 2019-03-07 DIAGNOSIS — Z6841 Body Mass Index (BMI) 40.0 and over, adult: Secondary | ICD-10-CM | POA: Diagnosis not present

## 2019-03-07 MED ORDER — CHLORTHALIDONE 25 MG PO TABS: 25.0000 mg | ORAL_TABLET | Freq: Every day | ORAL | 0 refills | Status: DC

## 2019-03-07 MED ORDER — VITAMIN D (ERGOCALCIFEROL) 1.25 MG (50000 UNIT) PO CAPS: 50000.0000 [IU] | ORAL_CAPSULE | ORAL | 0 refills | Status: DC

## 2019-03-07 MED FILL — CHLORTHALIDONE 25 MG TABLET: 25 | 30 days supply | Qty: 30 | Fill #0

## 2019-03-08 NOTE — Progress Notes (Signed)
Office: [phone]  /  Fax: [phone] TeleHealth Visit:  [name] has verbally consented to this TeleHealth visit today. The patient is located at home, the provider is located at the [employer] [facility] office. The participants in this visit include the listed provider and patient and any and all parties involved. The visit was conducted today via telephone. [name] was unable to use realtime audiovisual technology today and the telehealth visit was conducted via telephone 18 minutes).   HPI:   Chief Complaint: OBESITY Hannah Gibson is here to discuss her progress with her obesity treatment plan. She is on the Category 2 plan and is following her eating plan approximately 95 % of the time. She states she is exercising 0 minutes 0 times per week. Hannah Gibson states that she has lost 1 pound (weight 262 lbs). She has done well overall. She occasionally has cravings for spicy wings. We were unable to weigh the patient today for this TeleHealth visit. She feels as if she has lost  weight since her last visit. She has lost 19 lbs since starting treatment with [name].  Hypertension Hannah Gibson is a 58 y.o. female with hypertension. Her blood pressure reading is 120/72. [name] denies chest pain or shortness of breath on exertion. She is working weight loss to help control her blood pressure with the goal of decreasing her risk of heart attack and stroke. [name] blood pressure is well controlled.  Vitamin D deficiency Hannah Gibson has a diagnosis of vitamin D deficiency. She is currently taking vit D and denies nausea, vomiting or muscle weakness.  ASSESSMENT AND PLAN:  Essential hypertension - Plan: chlorthalidone (HYGROTON) 25 MG tablet  Vitamin D deficiency - Plan: Vitamin D, Ergocalciferol, (DRISDOL) 1.25 MG (50000 UT) CAPS capsule  Class 3 severe obesity with serious comorbidity and body mass index (BMI) of 40.0 to 44.9 in adult, unspecified obesity type  ([place])  PLAN:  Hypertension We discussed sodium restriction, working on healthy weight loss, and a regular exercise program as the means to achieve improved blood pressure control. Hannah Gibson agreed with this plan and agreed to follow up as directed. We will continue to monitor her blood pressure as well as her progress with the above lifestyle modifications. She agrees to continue chlorthalidone 25 mg once daily #30 with no refills and will watch for signs of hypotension as she continues her lifestyle modifications.  Vitamin D Deficiency Hannah Gibson was informed that low vitamin D levels contributes to fatigue and are associated with obesity, breast, and colon cancer. She agrees to continue to take prescription Vit D @50 ,000 IU every week #4 with no refill and will follow up for routine testing of vitamin D, at least 2-3 times per year. She was informed of the risk of over-replacement of vitamin D and agrees to not increase her dose unless she discusses this with [name] first. Hannah Gibson agrees to follow up as directed.  Obesity Hannah Gibson is currently in the action stage of change. As such, her goal is to continue with weight loss efforts She has agreed to follow the Category 2 plan Hannah Gibson has been instructed to work up to a goal of 150 minutes of combined cardio and strengthening exercise per week for weight loss and overall health benefits. We discussed the following Behavioral Modification Strategies today: planning for success, increase H2O intake, no skipping meals, keeping healthy foods in the home, increasing lean protein intake, decreasing simple carbohydrates, increasing vegetables, decrease eating out, work on meal planning and easy  cooking plans and ways to avoid boredom eating Hannah Gibson will weigh herself at home until she returns to the office.  Hannah Gibson has agreed to follow up with our clinic in 2 weeks. She was informed of the importance of frequent follow up visits to maximize her  success with intensive lifestyle modifications for her multiple health conditions.  ALLERGIES: No Known Allergies  MEDICATIONS: Current Outpatient Medications on File Prior to Visit  Medication Sig Dispense Refill   budesonide-formoterol (SYMBICORT) 160-4.5 MCG/ACT inhaler Inhale 2 puffs into the lungs 2 (two) times daily. 1 Inhaler 5   DUEXIS 800-26.6 MG TABS Take 1 tablet by mouth 3 (three) times daily.  6   metFORMIN (GLUCOPHAGE) 500 MG tablet Take 1 tablet (500 mg total) by mouth 2 (two) times daily. 60 tablet 0   montelukast (SINGULAIR) 10 MG tablet Take 1 tablet (10 mg total) by mouth at bedtime. 30 tablet 5   Multiple Vitamin (MULTIVITAMIN) tablet Take 1 tablet by mouth daily.     TRAVATAN Z 0.004 % SOLN ophthalmic solution   3   No current facility-administered medications on file prior to visit.     PAST MEDICAL HISTORY: Past Medical History:  Diagnosis Date   Asthma    Back pain    HSV infection    Joint pain    Lactose intolerance    Sciatica     PAST SURGICAL HISTORY: Past Surgical History:  Procedure Laterality Date   MYOMECTOMY  1998   TOTAL ABDOMINAL HYSTERECTOMY W/ BILATERAL SALPINGOOPHORECTOMY  12/03/1997   secondary to fibroids    SOCIAL HISTORY: Social History   Tobacco Use   Smoking status: Never Smoker   Smokeless tobacco: Never Used  Substance Use Topics   Alcohol use: Yes    Alcohol/week: 5.0 standard drinks    Types: 5 Standard drinks or equivalent per week   Drug use: No    FAMILY HISTORY: Family History  Problem Relation Age of Onset   Diabetes Mother    Hypertension Mother    Kidney disease Mother    Glaucoma Mother    Obesity Mother    Glaucoma Sister    Diabetes Maternal Aunt    Allergic rhinitis Neg Hx    Asthma Neg Hx    Eczema Neg Hx    Urticaria Neg Hx     ROS: Review of Systems  Constitutional: Positive for weight loss.  Respiratory: Negative for shortness of breath (on exertion).    Cardiovascular: Negative for chest pain.  Gastrointestinal: Negative for nausea and vomiting.  Musculoskeletal:       Negative for muscle weakness    PHYSICAL EXAM: Pt in no acute distress  RECENT LABS AND TESTS: BMET    Component Value Date/Time   NA 140 10/11/2018 1301   K 4.8 10/11/2018 1301   CL 100 10/11/2018 1301   CO2 21 10/11/2018 1301   GLUCOSE 82 10/11/2018 1301   BUN 15 10/11/2018 1301   CREATININE 0.86 10/11/2018 1301   CALCIUM 9.5 10/11/2018 1301   GFRNONAA 76 10/11/2018 1301   GFRAA 88 10/11/2018 1301   Lab Results  Component Value Date   HGBA1C 6.6 (H) 10/11/2018   Lab Results  Component Value Date   INSULIN 11.5 10/11/2018   CBC    Component Value Date/Time   WBC 5.9 07/28/2018 1549   RBC 4.89 07/28/2018 1549   HGB 14.0 07/28/2018 1549   HGB 14.1 12/19/2012 1603   HCT 43.0 07/28/2018 1549   MCV 88 07/28/2018  1549   MCH 28.6 07/28/2018 1549   MCHC 32.6 07/28/2018 1549   RDW 12.2 (L) 07/28/2018 1549   LYMPHSABS 1.9 07/28/2018 1549   EOSABS 0.5 (H) 07/28/2018 1549   BASOSABS 0.0 07/28/2018 1549   Iron/TIBC/Ferritin/ %Sat No results found for: IRON, TIBC, FERRITIN, IRONPCTSAT Lipid Panel     Component Value Date/Time   CHOL 181 10/11/2018 1301   TRIG 46 10/11/2018 1301   HDL 49 10/11/2018 1301   CHOLHDL 3.7 10/11/2018 1301   LDLCALC 123 (H) 10/11/2018 1301   Hepatic Function Panel     Component Value Date/Time   PROT 6.9 10/11/2018 1301   ALBUMIN 4.1 10/11/2018 1301   AST 19 10/11/2018 1301   ALT 22 10/11/2018 1301   ALKPHOS 110 10/11/2018 1301   BILITOT 0.2 10/11/2018 1301      Component Value Date/Time   TSH 1.880 10/11/2018 1301     Ref. Range 10/11/2018 13:01  Vitamin D, 25-Hydroxy Latest Ref Range: 30.0 - 100.0 ng/mL 31.0    I, Doreene Nest, am acting as Location manager for [employer]. Owens Shark, DO  I have reviewed the above documentation for accuracy and completeness, and I agree with the above. -Jearld Lesch, DO

## 2019-03-12 ENCOUNTER — Encounter (INDEPENDENT_AMBULATORY_CARE_PROVIDER_SITE_OTHER): Payer: Self-pay | Admitting: Bariatrics

## 2019-03-12 ENCOUNTER — Other Ambulatory Visit: Payer: Self-pay | Admitting: Allergy

## 2019-03-12 MED FILL — MONTELUKAST SOD 10 MG TAB: 10 | 30 days supply | Qty: 30 | Fill #0

## 2019-03-20 ENCOUNTER — Encounter (INDEPENDENT_AMBULATORY_CARE_PROVIDER_SITE_OTHER): Payer: Self-pay | Admitting: Bariatrics

## 2019-03-20 ENCOUNTER — Other Ambulatory Visit: Payer: Self-pay

## 2019-03-20 ENCOUNTER — Telehealth (INDEPENDENT_AMBULATORY_CARE_PROVIDER_SITE_OTHER): Payer: 59 | Admitting: Bariatrics

## 2019-03-20 DIAGNOSIS — Z6841 Body Mass Index (BMI) 40.0 and over, adult: Secondary | ICD-10-CM

## 2019-03-20 DIAGNOSIS — E559 Vitamin D deficiency, unspecified: Secondary | ICD-10-CM

## 2019-03-20 DIAGNOSIS — I10 Essential (primary) hypertension: Secondary | ICD-10-CM | POA: Diagnosis not present

## 2019-03-20 MED ORDER — VITAMIN D (ERGOCALCIFEROL) 1.25 MG (50000 UNIT) PO CAPS: 50000.0000 [IU] | ORAL_CAPSULE | ORAL | 0 refills | Status: DC

## 2019-03-20 MED FILL — VIT D2 1.25 MG (50,000 UNIT: 1.25 MG | 28 days supply | Qty: 4 | Fill #0

## 2019-03-21 NOTE — Progress Notes (Signed)
Office: [phone]  /  Fax: [phone] TeleHealth Visit:  [name] has verbally consented to this TeleHealth visit today. The patient is located at home, the provider is located at the [employer] [facility] office. The participants in this visit include the listed provider and patient. The visit was conducted today via telephone call (15 minutes).  HPI:   Chief Complaint: OBESITY Hannah Gibson is here to discuss her progress with her obesity treatment plan. She is on the Category 2 plan and is following her eating plan approximately 85% of the time. She states she is exercising 0 minutes 0 times per week. Hannah Gibson states that her weight is up 1.5 lbs (weight 264 lbs). She is doing well overall and doing well with her water intake. We were unable to weigh the patient today for this TeleHealth visit. She feels as if she has gained 1.5 lbs since her last visit. She has lost 17 lbs since starting treatment with [name].  Hypertension KENNIE KARAPETIAN is a 58 y.o. female with hypertension and is taking [place].  [name] denies chest pain or shortness of breath on exertion. She is working weight loss to help control her blood pressure with the goal of decreasing her risk of heart attack and stroke. Hannah Gibson's blood pressure is currently controlled. Her last A1c was 6.6 on 10/11/2018 and insulin was 11.5.  Vitamin D deficiency Hannah Gibson has a diagnosis of Vitamin D deficiency. Her last Vitamin D level was reported to be 31.0 on 10/11/2018. She is currently taking prescription Vit D and denies nausea, vomiting or muscle weakness.  ASSESSMENT AND PLAN:  Essential hypertension  Vitamin D deficiency - Plan: Vitamin D, Ergocalciferol, (DRISDOL) 1.25 MG (50000 UT) CAPS capsule  Class 3 severe obesity with serious comorbidity and body mass index (BMI) of 40.0 to 44.9 in adult, unspecified obesity type ([place])  PLAN:  Hypertension We discussed sodium restriction, working on  healthy weight loss, and a regular exercise program as the means to achieve improved blood pressure control. Hannah Gibson agreed with this plan and agreed to follow up as directed. We will continue to monitor her blood pressure as well as her progress with the above lifestyle modifications. She will continue her medications as prescribed and will watch for signs of hypotension as she continues her lifestyle modifications.  Vitamin D Deficiency [name] was informed that low Vitamin D levels contributes to fatigue and are associated with obesity, breast, and colon cancer. She agrees to continue to take prescription Vit D @ 50,000 IU every week and will follow-up for routine testing of Vitamin D, at least 2-3 times per year. She was informed of the risk of over-replacement of Vitamin D and agrees to not increase her dose unless she discusses this with [name] first. [name] agrees to follow-up with our clinic in 2 weeks.  Obesity Hannah Gibson is currently in the action stage of change. As such, her goal is to continue with weight loss efforts. She has agreed to follow the Category 2 plan. [name] has been instructed to work up to a goal of 150 minutes of combined cardio and strengthening exercise per week for weight loss and overall health benefits. We discussed the following Behavioral Modification Strategies today: increasing lean protein intake, decreasing simple carbohydrates, increasing vegetables, increase H20 intake, decreasing sodium intake, decrease eating out, work on meal planning and easy cooking plans, better snacking choices, and planning for success.  Hannah Gibson has agreed to follow-up with our clinic in 2 weeks. She was  informed of the importance of frequent follow-up visits to maximize her success with intensive lifestyle modifications for her multiple health conditions.  ALLERGIES: No Known Allergies  MEDICATIONS: Current Outpatient Medications on File Prior to Visit  Medication Sig  Dispense Refill  . budesonide-formoterol (SYMBICORT) 160-4.5 MCG/ACT inhaler Inhale 2 puffs into the lungs 2 (two) times daily. 1 Inhaler 5  . chlorthalidone (HYGROTON) 25 MG tablet Take 1 tablet (25 mg total) by mouth daily. 30 tablet 0  . DUEXIS 800-26.6 MG TABS Take 1 tablet by mouth 3 (three) times daily.  6  . metFORMIN (GLUCOPHAGE) 500 MG tablet Take 1 tablet (500 mg total) by mouth 2 (two) times daily. 60 tablet 0  . montelukast (SINGULAIR) 10 MG tablet TAKE 1 TABLET BY MOUTH EVERY NIGHT AT BEDTIME 30 tablet 2  . Multiple Vitamin (MULTIVITAMIN) tablet Take 1 tablet by mouth daily.    . TRAVATAN Z 0.004 % SOLN ophthalmic solution   3   No current facility-administered medications on file prior to visit.     PAST MEDICAL HISTORY: Past Medical History:  Diagnosis Date  . Asthma   . Back pain   . HSV infection   . Joint pain   . Lactose intolerance   . Sciatica     PAST SURGICAL HISTORY: Past Surgical History:  Procedure Laterality Date  . MYOMECTOMY  1998  . TOTAL ABDOMINAL HYSTERECTOMY W/ BILATERAL SALPINGOOPHORECTOMY  12/03/1997   secondary to fibroids    SOCIAL HISTORY: Social History   Tobacco Use  . Smoking status: Never Smoker  . Smokeless tobacco: Never Used  Substance Use Topics  . Alcohol use: Yes    Alcohol/week: 5.0 standard drinks    Types: 5 Standard drinks or equivalent per week  . Drug use: No    FAMILY HISTORY: Family History  Problem Relation Age of Onset  . Diabetes Mother   . Hypertension Mother   . Kidney disease Mother   . Glaucoma Mother   . Obesity Mother   . Glaucoma Sister   . Diabetes Maternal Aunt   . Allergic rhinitis Neg Hx   . Asthma Neg Hx   . Eczema Neg Hx   . Urticaria Neg Hx    ROS: Review of Systems  Respiratory: Negative for shortness of breath.   Cardiovascular: Negative for chest pain.  Gastrointestinal: Negative for nausea and vomiting.  Musculoskeletal:       Negative for muscle weakness.   PHYSICAL EXAM: Pt  in no acute distress  RECENT LABS AND TESTS: BMET    Component Value Date/Time   NA 140 10/11/2018 1301   K 4.8 10/11/2018 1301   CL 100 10/11/2018 1301   CO2 21 10/11/2018 1301   GLUCOSE 82 10/11/2018 1301   BUN 15 10/11/2018 1301   CREATININE 0.86 10/11/2018 1301   CALCIUM 9.5 10/11/2018 1301   GFRNONAA 76 10/11/2018 1301   GFRAA 88 10/11/2018 1301   Lab Results  Component Value Date   HGBA1C 6.6 (H) 10/11/2018   Lab Results  Component Value Date   INSULIN 11.5 10/11/2018   CBC    Component Value Date/Time   WBC 5.9 07/28/2018 1549   RBC 4.89 07/28/2018 1549   HGB 14.0 07/28/2018 1549   HGB 14.1 12/19/2012 1603   HCT 43.0 07/28/2018 1549   MCV 88 07/28/2018 1549   MCH 28.6 07/28/2018 1549   MCHC 32.6 07/28/2018 1549   RDW 12.2 (L) 07/28/2018 1549   LYMPHSABS 1.9 07/28/2018 1549  EOSABS 0.5 (H) 07/28/2018 1549   BASOSABS 0.0 07/28/2018 1549   Iron/TIBC/Ferritin/ %Sat No results found for: IRON, TIBC, FERRITIN, IRONPCTSAT Lipid Panel     Component Value Date/Time   CHOL 181 10/11/2018 1301   TRIG 46 10/11/2018 1301   HDL 49 10/11/2018 1301   CHOLHDL 3.7 10/11/2018 1301   LDLCALC 123 (H) 10/11/2018 1301   Hepatic Function Panel     Component Value Date/Time   PROT 6.9 10/11/2018 1301   ALBUMIN 4.1 10/11/2018 1301   AST 19 10/11/2018 1301   ALT 22 10/11/2018 1301   ALKPHOS 110 10/11/2018 1301   BILITOT 0.2 10/11/2018 1301      Component Value Date/Time   TSH 1.880 10/11/2018 1301   Results for PATRYCE, DEPRIEST A (MRN [id number]) as of 03/21/2019 07:21  Ref. Range 10/11/2018 13:01  Vitamin D, 25-Hydroxy Latest Ref Range: 30.0 - 100.0 ng/mL 31.0    I, Michaelene Song, am acting as Location manager for [employer], DO  I have reviewed the above documentation for accuracy and completeness, and I agree with the above. -Jearld Lesch, DO

## 2019-03-24 MED FILL — TRAVOPROST (BAK FREE) 0.004: 0.004 | 25 days supply | Qty: 3 | Fill #2

## 2019-04-04 ENCOUNTER — Ambulatory Visit (INDEPENDENT_AMBULATORY_CARE_PROVIDER_SITE_OTHER): Payer: 59 | Admitting: Bariatrics

## 2019-04-04 ENCOUNTER — Other Ambulatory Visit: Payer: Self-pay

## 2019-04-04 ENCOUNTER — Encounter (INDEPENDENT_AMBULATORY_CARE_PROVIDER_SITE_OTHER): Payer: Self-pay | Admitting: Bariatrics

## 2019-04-04 VITALS — BP 106/73 | HR 79 | Temp 98.3°F | Ht 65.0 in | Wt 263.0 lb

## 2019-04-04 DIAGNOSIS — Z9189 Other specified personal risk factors, not elsewhere classified: Secondary | ICD-10-CM

## 2019-04-04 DIAGNOSIS — Z6841 Body Mass Index (BMI) 40.0 and over, adult: Secondary | ICD-10-CM | POA: Diagnosis not present

## 2019-04-04 DIAGNOSIS — E118 Type 2 diabetes mellitus with unspecified complications: Secondary | ICD-10-CM | POA: Diagnosis not present

## 2019-04-04 DIAGNOSIS — I10 Essential (primary) hypertension: Secondary | ICD-10-CM | POA: Diagnosis not present

## 2019-04-04 MED ORDER — CHLORTHALIDONE 25 MG PO TABS: 25.0000 mg | ORAL_TABLET | Freq: Every day | ORAL | 0 refills | Status: DC

## 2019-04-04 MED ORDER — METFORMIN HCL 500 MG PO TABS: 500.0000 mg | ORAL_TABLET | Freq: Two times a day (BID) | ORAL | 0 refills | Status: DC

## 2019-04-04 MED FILL — metFORMIN HCL 500 MG TABS: 500 | 30 days supply | Qty: 60 | Fill #0

## 2019-04-04 MED FILL — CHLORTHALIDONE 25 MG TABLET: 25 | 30 days supply | Qty: 30 | Fill #0

## 2019-04-04 NOTE — Progress Notes (Signed)
Office: [phone]  /  Fax: [phone]   HPI:   Chief Complaint: OBESITY Hannah Gibson is here to discuss her progress with her obesity treatment plan. She is on the Category 2 plan and is following her eating plan approximately 75% of the time. She states she is exercising 0 minutes 0 times per week. Hannah Gibson is up 3 lbs. She states she did not struggle with anything, did not go by the plan, and is not doing any stress eating.  Her weight is 263 lb (119.3 kg) today and has had a weight gain of 3 lbs since her last visit. She has lost 18 lbs since starting treatment with [name].  Hypertension Hannah Gibson is a 58 y.o. female with hypertension.  Hannah Gibson denies lightheadedness. She is working weight loss to help control her blood pressure with the goal of decreasing her risk of heart attack and stroke. Hannah Gibson's blood pressure is currently well controlled at 106/73.  At risk for cardiovascular disease Hannah Gibson is at a higher than average risk for cardiovascular disease due to obesity. She currently denies any chest pain.  Diabetes II Hannah Gibson has a diagnosis of diabetes type II. Hannah Gibson does not check her blood sugars. Last A1c was 6.6 on 10/11/2018. She has been working on intensive lifestyle modifications including diet, exercise, and weight loss to help control her blood glucose levels. No polyphagia.  ASSESSMENT AND PLAN:  Essential hypertension - Plan: chlorthalidone (HYGROTON) 25 MG tablet  Controlled type 2 diabetes mellitus with complication, without long-term current use of insulin (Hannah Gibson) - Plan: metFORMIN (GLUCOPHAGE) 500 MG tablet  At risk for heart disease  Class 3 severe obesity with serious comorbidity and body mass index (BMI) of 40.0 to 44.9 in adult, unspecified obesity type ([place])  PLAN:  Hypertension We discussed sodium restriction, working on healthy weight loss, and a regular exercise program as the means to achieve improved blood pressure  control. Hannah Gibson agreed with this plan and agreed to follow up as directed. We will continue to monitor her blood pressure as well as her progress with the above lifestyle modifications. Hannah Gibson was given a prescription for chlorthalidone  25 mg 1 a day #30 with 0 refills and she agrees to follow-up with our clinic in 4 weeks. She will will watch for signs of hypotension as she continues her lifestyle modifications.  Cardiovascular risk counseling Hannah Gibson was given extended (15 minutes) coronary artery disease prevention counseling today. She is 58 y.o. female and has risk factors for heart disease including obesity. We discussed intensive lifestyle modifications today with an emphasis on specific weight loss instructions and strategies. Pt was also informed of the importance of increasing exercise and decreasing saturated fats to help prevent heart disease.  Diabetes II Hannah Gibson has been given extensive diabetes education by myself today including ideal fasting and post-prandial blood glucose readings, individual ideal HgA1c goals  and hypoglycemia prevention. We discussed the importance of good blood sugar control to decrease the likelihood of diabetic complications such as nephropathy, neuropathy, limb loss, blindness, coronary artery disease, and death. We discussed the importance of intensive lifestyle modification including diet, exercise and weight loss as the first line treatment for diabetes. Hannah Gibson was given a prescription for metformin 500 mg 1 BID #60 with 0 refills and agrees to follow-up with our clinic in 4 weeks.  Obesity Hannah Gibson is currently in the action stage of change. As such, her goal is to continue with weight loss efforts. She has agreed to follow the Category  2 plan. Hannah Gibson will work on meal planning and intentional eating. She will get the Between. Hannah Gibson has been instructed to start walking for weight loss and overall health benefits.  We discussed the following Behavioral Modification Strategies today: increasing lean protein intake, decreasing simple carbohydrates, increasing vegetables, increase H20 intake, decrease eating out, no skipping meals, work on meal planning, easy cooking plans, keeping healthy foods in the home, and planning for success.  Hannah Gibson has agreed to follow-up with our clinic in 4 weeks. She was informed of the importance of frequent follow-up visits to maximize her success with intensive lifestyle modifications for her multiple health conditions.  ALLERGIES: No Known Allergies  MEDICATIONS: Current Outpatient Medications on File Prior to Visit  Medication Sig Dispense Refill  . budesonide-formoterol (SYMBICORT) 160-4.5 MCG/ACT inhaler Inhale 2 puffs into the lungs 2 (two) times daily. 1 Inhaler 5  . DUEXIS 800-26.6 MG TABS Take 1 tablet by mouth 3 (three) times daily.  6  . montelukast (SINGULAIR) 10 MG tablet TAKE 1 TABLET BY MOUTH EVERY NIGHT AT BEDTIME 30 tablet 2  . Multiple Vitamin (MULTIVITAMIN) tablet Take 1 tablet by mouth daily.    . TRAVATAN Z 0.004 % SOLN ophthalmic solution   3  . Vitamin D, Ergocalciferol, (DRISDOL) 1.25 MG (50000 UT) CAPS capsule Take 1 capsule (50,000 Units total) by mouth every 7 (seven) days. 4 capsule 0   No current facility-administered medications on file prior to visit.     PAST MEDICAL HISTORY: Past Medical History:  Diagnosis Date  . Asthma   . Back pain   . HSV infection   . Joint pain   . Lactose intolerance   . Sciatica     PAST SURGICAL HISTORY: Past Surgical History:  Procedure Laterality Date  . MYOMECTOMY  1998  . TOTAL ABDOMINAL HYSTERECTOMY W/ BILATERAL SALPINGOOPHORECTOMY  12/03/1997   secondary to fibroids    SOCIAL HISTORY: Social History   Tobacco Use  . Smoking status: Never Smoker  . Smokeless tobacco: Never Used  Substance Use Topics  . Alcohol use: Yes    Alcohol/week: 5.0 standard drinks    Types: 5 Standard drinks or  equivalent per week  . Drug use: No    FAMILY HISTORY: Family History  Problem Relation Age of Onset  . Diabetes Mother   . Hypertension Mother   . Kidney disease Mother   . Glaucoma Mother   . Obesity Mother   . Glaucoma Sister   . Diabetes Maternal Aunt   . Allergic rhinitis Neg Hx   . Asthma Neg Hx   . Eczema Neg Hx   . Urticaria Neg Hx    ROS: Review of Systems  Neurological:       Negative for lightheadedness.  Endo/Heme/Allergies:       Negative for polyphagia.   PHYSICAL EXAM: Blood pressure 106/73, pulse 79, temperature 98.3 F (36.8 C), temperature source Oral, height 5\' 5"  (1.651 m), weight 263 lb (119.3 kg), last menstrual period 12/09/1997, SpO2 99 %. Body mass index is 43.77 kg/m. Physical Exam Vitals signs reviewed.  Constitutional:      Appearance: Normal appearance. She is obese.  Cardiovascular:     Rate and Rhythm: Normal rate.     Pulses: Normal pulses.  Pulmonary:     Effort: Pulmonary effort is normal.     Breath sounds: Normal breath sounds.  Musculoskeletal: Normal range of motion.  Skin:    General: Skin is warm and dry.  Neurological:  Mental Status: She is alert and oriented to person, place, and time.  Psychiatric:        Behavior: Behavior normal.   RECENT LABS AND TESTS: BMET    Component Value Date/Time   NA 140 10/11/2018 1301   K 4.8 10/11/2018 1301   CL 100 10/11/2018 1301   CO2 21 10/11/2018 1301   GLUCOSE 82 10/11/2018 1301   BUN 15 10/11/2018 1301   CREATININE 0.86 10/11/2018 1301   CALCIUM 9.5 10/11/2018 1301   GFRNONAA 76 10/11/2018 1301   GFRAA 88 10/11/2018 1301   Lab Results  Component Value Date   HGBA1C 6.6 (H) 10/11/2018   Lab Results  Component Value Date   INSULIN 11.5 10/11/2018   CBC    Component Value Date/Time   WBC 5.9 07/28/2018 1549   RBC 4.89 07/28/2018 1549   HGB 14.0 07/28/2018 1549   HGB 14.1 12/19/2012 1603   HCT 43.0 07/28/2018 1549   MCV 88 07/28/2018 1549   MCH 28.6  07/28/2018 1549   MCHC 32.6 07/28/2018 1549   RDW 12.2 (L) 07/28/2018 1549   LYMPHSABS 1.9 07/28/2018 1549   EOSABS 0.5 (H) 07/28/2018 1549   BASOSABS 0.0 07/28/2018 1549   Iron/TIBC/Ferritin/ %Sat No results found for: IRON, TIBC, FERRITIN, IRONPCTSAT Lipid Panel     Component Value Date/Time   CHOL 181 10/11/2018 1301   TRIG 46 10/11/2018 1301   HDL 49 10/11/2018 1301   CHOLHDL 3.7 10/11/2018 1301   LDLCALC 123 (H) 10/11/2018 1301   Hepatic Function Panel     Component Value Date/Time   PROT 6.9 10/11/2018 1301   ALBUMIN 4.1 10/11/2018 1301   AST 19 10/11/2018 1301   ALT 22 10/11/2018 1301   ALKPHOS 110 10/11/2018 1301   BILITOT 0.2 10/11/2018 1301      Component Value Date/Time   TSH 1.880 10/11/2018 1301   Results for RAYANA, GEURIN A (MRN [id number]) as of 04/04/2019 16:23  Ref. Range 10/11/2018 13:01  Vitamin D, 25-Hydroxy Latest Ref Range: 30.0 - 100.0 ng/mL 31.0   OBESITY BEHAVIORAL INTERVENTION VISIT  Today's visit was #10   Starting weight: 281 lbs Starting date: 10/11/2018 Today's weight: 263 lbs  Today's date: 04/04/2019 Total lbs lost to date: 18   04/04/2019  Height 5\' 5"  (1.651 m)  Weight 263 lb (119.3 kg)  BMI (Calculated) 43.77  BLOOD PRESSURE - SYSTOLIC 416  BLOOD PRESSURE - DIASTOLIC 73   Body Fat % 60.6 %  Total Body Water (lbs) 83 lbs   ASK: We discussed the diagnosis of obesity with Hannah Gibson today and Shoua agreed to give [name] permission to discuss obesity behavioral modification therapy today.  ASSESS: Hannah Gibson has the diagnosis of obesity and her BMI today is 43.9. Hannah Gibson is in the action stage of change.   ADVISE: Hannah Gibson was educated on the multiple health risks of obesity as well as the benefit of weight loss to improve her health. She was advised of the need for long term treatment and the importance of lifestyle modifications to improve her current health and to decrease her risk of future health problems.   AGREE: Multiple dietary modification options and treatment options were discussed and  Lokelani agreed to follow the recommendations documented in the above note.  ARRANGE: Hannah Gibson was educated on the importance of frequent visits to treat obesity as outlined per CMS and USPSTF guidelines and agreed to schedule her next follow up appointment today.  Hannah Gibson Dk, am acting as Location manager  for Jearld Lesch, DO  I have reviewed the above documentation for accuracy and completeness, and I agree with the above. -Jearld Lesch, DO

## 2019-04-18 MED FILL — MONTELUKAST SOD 10 MG TAB: 10 | 30 days supply | Qty: 30 | Fill #1

## 2019-05-02 ENCOUNTER — Other Ambulatory Visit: Payer: Self-pay

## 2019-05-02 ENCOUNTER — Ambulatory Visit (INDEPENDENT_AMBULATORY_CARE_PROVIDER_SITE_OTHER): Payer: 59 | Admitting: Bariatrics

## 2019-05-02 ENCOUNTER — Encounter (INDEPENDENT_AMBULATORY_CARE_PROVIDER_SITE_OTHER): Payer: Self-pay | Admitting: Bariatrics

## 2019-05-02 VITALS — BP 120/82 | HR 76 | Temp 98.4°F | Ht 65.0 in | Wt 261.0 lb

## 2019-05-02 DIAGNOSIS — E559 Vitamin D deficiency, unspecified: Secondary | ICD-10-CM

## 2019-05-02 DIAGNOSIS — Z6841 Body Mass Index (BMI) 40.0 and over, adult: Secondary | ICD-10-CM | POA: Diagnosis not present

## 2019-05-02 DIAGNOSIS — E119 Type 2 diabetes mellitus without complications: Secondary | ICD-10-CM | POA: Diagnosis not present

## 2019-05-02 DIAGNOSIS — Z9189 Other specified personal risk factors, not elsewhere classified: Secondary | ICD-10-CM

## 2019-05-02 MED ORDER — VITAMIN D (ERGOCALCIFEROL) 1.25 MG (50000 UNIT) PO CAPS: 50000.0000 [IU] | ORAL_CAPSULE | ORAL | 0 refills | Status: DC

## 2019-05-02 MED FILL — VIT D2 1.25 MG (50,000 UNIT: 1.25 MG | 28 days supply | Qty: 4 | Fill #0

## 2019-05-02 NOTE — Progress Notes (Signed)
Office: [phone]  /  Fax: [phone]   HPI:   Chief Complaint: OBESITY Hannah Gibson is here to discuss her progress with her obesity treatment plan. She is on the Category 2 plan and is following her eating plan approximately 85% of the time. She states she is exercising 0 minutes 0 times per week. Hannah Gibson is down 2 lbs and is doing well overall. She does not get all of her meals in but reports she is doing well with her water intake. Her weight is 261 lb (118.4 kg) today and has had a weight loss of 2 pounds over a period of 4 weeks since her last visit. She has lost 20 lbs since starting treatment with [name].  Vitamin D deficiency Hannah Gibson has a diagnosis of Vitamin D deficiency. She is currently taking prescription Vit D and denies nausea, vomiting or muscle weakness.  At risk for osteopenia and osteoporosis Hannah Gibson is at higher risk of osteopenia and osteoporosis due to Vitamin D deficiency.   Diabetes II, controlled Hannah Gibson has a diagnosis of diabetes type II, which is controlled. Hannah Gibson states fasting blood sugars range between 120 and 130 and denies any hypoglycemic episodes. Last A1c was 6.6 on 10/11/2018. She has been working on intensive lifestyle modifications including diet, exercise, and weight loss to help control her blood glucose levels.  ASSESSMENT AND PLAN:  Vitamin D deficiency - Plan: Vitamin D, Ergocalciferol, (DRISDOL) 1.25 MG (50000 UT) CAPS capsule  Type 2 diabetes mellitus without complication, without long-term current use of insulin ([facility])  At risk for osteoporosis  Class 3 severe obesity with serious comorbidity and body mass index (BMI) of 40.0 to 44.9 in adult, unspecified obesity type ([place])  PLAN:  Vitamin D Deficiency Hannah Gibson was informed that low Vitamin D levels contributes to fatigue and are associated with obesity, breast, and colon cancer. She agrees to continue to take prescription Vit D @ 50,000 IU every week #4 with 0 refills and  will follow-up for routine testing of Vitamin D, at least 2-3 times per year. She was informed of the risk of over-replacement of Vitamin D and agrees to not increase her dose unless she discusses this with [name] first. Hannah Gibson agrees to follow-up with our clinic in 2 weeks.  At risk for osteopenia and osteoporosis Hannah Gibson was given extended  (15 minutes) osteoporosis prevention counseling today. Hannah Gibson is at risk for osteopenia and osteoporosis due to her Vitamin D deficiency. She was encouraged to take her Vitamin D and follow her higher calcium diet and increase strengthening exercise to help strengthen her bones and decrease her risk of osteopenia and osteoporosis.  Diabetes II, controlled Hannah Gibson has been given extensive diabetes education by myself today including ideal fasting and post-prandial blood glucose readings, individual ideal Hgb A1c goals  and hypoglycemia prevention. We discussed the importance of good blood sugar control to decrease the likelihood of diabetic complications such as nephropathy, neuropathy, limb loss, blindness, coronary artery disease, and death. We discussed the importance of intensive lifestyle modification including diet, exercise and weight loss as the first line treatment for diabetes. Hannah Gibson agrees to continue her diabetes medications and will follow-up at the agreed upon time.  Obesity Hannah Gibson is currently in the action stage of change. As such, her goal is to continue with weight loss efforts. She has agreed to follow the Category 2 plan. Hannah Gibson will increase her protein and will try not to skip meals. Hannah Gibson will consider getting the walking treadmill for weight loss and overall health benefits.  We discussed the following Behavioral Modification Strategies today: increasing lean protein intake, decreasing simple carbohydrates, increasing vegetables, increase H20 intake, decrease eating out, no skipping meals, work on meal planning and easy  cooking plans, and keeping healthy foods in the home.  Hannah Gibson has agreed to follow-up with our clinic in 2 weeks. She was informed of the importance of frequent follow-up visits to maximize her success with intensive lifestyle modifications for her multiple health conditions.  ALLERGIES: No Known Allergies  MEDICATIONS: Current Outpatient Medications on File Prior to Visit  Medication Sig Dispense Refill  . budesonide-formoterol (SYMBICORT) 160-4.5 MCG/ACT inhaler Inhale 2 puffs into the lungs 2 (two) times daily. 1 Inhaler 5  . chlorthalidone (HYGROTON) 25 MG tablet Take 1 tablet (25 mg total) by mouth daily. 30 tablet 0  . DUEXIS 800-26.6 MG TABS Take 1 tablet by mouth 3 (three) times daily.  6  . metFORMIN (GLUCOPHAGE) 500 MG tablet Take 1 tablet (500 mg total) by mouth 2 (two) times daily. 60 tablet 0  . montelukast (SINGULAIR) 10 MG tablet TAKE 1 TABLET BY MOUTH EVERY NIGHT AT BEDTIME 30 tablet 2  . Multiple Vitamin (MULTIVITAMIN) tablet Take 1 tablet by mouth daily.    . TRAVATAN Z 0.004 % SOLN ophthalmic solution   3  . Vitamin D, Ergocalciferol, (DRISDOL) 1.25 MG (50000 UT) CAPS capsule Take 1 capsule (50,000 Units total) by mouth every 7 (seven) days. 4 capsule 0   No current facility-administered medications on file prior to visit.     PAST MEDICAL HISTORY: Past Medical History:  Diagnosis Date  . Asthma   . Back pain   . HSV infection   . Joint pain   . Lactose intolerance   . Sciatica     PAST SURGICAL HISTORY: Past Surgical History:  Procedure Laterality Date  . MYOMECTOMY  1998  . TOTAL ABDOMINAL HYSTERECTOMY W/ BILATERAL SALPINGOOPHORECTOMY  12/03/1997   secondary to fibroids    SOCIAL HISTORY: Social History   Tobacco Use  . Smoking status: Never Smoker  . Smokeless tobacco: Never Used  Substance Use Topics  . Alcohol use: Yes    Alcohol/week: 5.0 standard drinks    Types: 5 Standard drinks or equivalent per week  . Drug use: No    FAMILY  HISTORY: Family History  Problem Relation Age of Onset  . Diabetes Mother   . Hypertension Mother   . Kidney disease Mother   . Glaucoma Mother   . Obesity Mother   . Glaucoma Sister   . Diabetes Maternal Aunt   . Allergic rhinitis Neg Hx   . Asthma Neg Hx   . Eczema Neg Hx   . Urticaria Neg Hx    ROS: Review of Systems  Gastrointestinal: Negative for nausea and vomiting.  Musculoskeletal:       Negative for muscle weakness.  Endo/Heme/Allergies:       Negative for hypoglycemia.   PHYSICAL EXAM: Blood pressure 120/82, pulse 76, temperature 98.4 F (36.9 C), temperature source Oral, height 5\' 5"  (1.651 m), weight 261 lb (118.4 kg), last menstrual period 12/09/1997, SpO2 99 %. Body mass index is 43.43 kg/m. Physical Exam Vitals signs reviewed.  Constitutional:      Appearance: Normal appearance. She is obese.  Cardiovascular:     Rate and Rhythm: Normal rate.     Pulses: Normal pulses.  Pulmonary:     Effort: Pulmonary effort is normal.     Breath sounds: Normal breath sounds.  Musculoskeletal: Normal range of  motion.  Skin:    General: Skin is warm and dry.  Neurological:     Mental Status: She is alert and oriented to person, place, and time.  Psychiatric:        Behavior: Behavior normal.   RECENT LABS AND TESTS: BMET    Component Value Date/Time   NA 140 10/11/2018 1301   K 4.8 10/11/2018 1301   CL 100 10/11/2018 1301   CO2 21 10/11/2018 1301   GLUCOSE 82 10/11/2018 1301   BUN 15 10/11/2018 1301   CREATININE 0.86 10/11/2018 1301   CALCIUM 9.5 10/11/2018 1301   GFRNONAA 76 10/11/2018 1301   GFRAA 88 10/11/2018 1301   Lab Results  Component Value Date   HGBA1C 6.6 (H) 10/11/2018   Lab Results  Component Value Date   INSULIN 11.5 10/11/2018   CBC    Component Value Date/Time   WBC 5.9 07/28/2018 1549   RBC 4.89 07/28/2018 1549   HGB 14.0 07/28/2018 1549   HGB 14.1 12/19/2012 1603   HCT 43.0 07/28/2018 1549   MCV 88 07/28/2018 1549   MCH  28.6 07/28/2018 1549   MCHC 32.6 07/28/2018 1549   RDW 12.2 (L) 07/28/2018 1549   LYMPHSABS 1.9 07/28/2018 1549   EOSABS 0.5 (H) 07/28/2018 1549   BASOSABS 0.0 07/28/2018 1549   Iron/TIBC/Ferritin/ %Sat No results found for: IRON, TIBC, FERRITIN, IRONPCTSAT Lipid Panel     Component Value Date/Time   CHOL 181 10/11/2018 1301   TRIG 46 10/11/2018 1301   HDL 49 10/11/2018 1301   CHOLHDL 3.7 10/11/2018 1301   LDLCALC 123 (H) 10/11/2018 1301   Hepatic Function Panel     Component Value Date/Time   PROT 6.9 10/11/2018 1301   ALBUMIN 4.1 10/11/2018 1301   AST 19 10/11/2018 1301   ALT 22 10/11/2018 1301   ALKPHOS 110 10/11/2018 1301   BILITOT 0.2 10/11/2018 1301      Component Value Date/Time   TSH 1.880 10/11/2018 1301   Results for SHRITHA, BRESEE A (MRN [id number]) as of 05/02/2019 15:47  Ref. Range 10/11/2018 13:01  Vitamin D, 25-Hydroxy Latest Ref Range: 30.0 - 100.0 ng/mL 31.0   OBESITY BEHAVIORAL INTERVENTION VISIT  Today's visit was #11  Starting weight: 281 lbs Starting date: 10/11/2018 Today's weight: 261 lbs Today's date: 05/02/2019 Total lbs lost to date: 20   05/02/2019  Height 5\' 5"  (1.651 m)  Weight 261 lb (118.4 kg)  BMI (Calculated) 43.43  BLOOD PRESSURE - SYSTOLIC 242  BLOOD PRESSURE - DIASTOLIC 82   Body Fat % 68.3 %  Total Body Water (lbs) 83 lbs   ASK: We discussed the diagnosis of obesity with [name] today and Paislei agreed to give [name] permission to discuss obesity behavioral modification therapy today.  ASSESS: Ingris has the diagnosis of obesity and her BMI today is 43.4. Porschia is in the action stage of change.   ADVISE: Jametta was educated on the multiple health risks of obesity as well as the benefit of weight loss to improve her health. She was advised of the need for long term treatment and the importance of lifestyle modifications to improve her current health and to decrease her risk of future health problems.   AGREE: Multiple dietary modification options and treatment options were discussed and  Kashawn agreed to follow the recommendations documented in the above note.  ARRANGE: Zeidy was educated on the importance of frequent visits to treat obesity as outlined per CMS and USPSTF guidelines and agreed  to schedule her next follow up appointment today.  Migdalia Dk, am acting as Location manager for [employer], DO   I have reviewed the above documentation for accuracy and completeness, and I agree with the above. -Jearld Lesch, DO

## 2019-05-03 ENCOUNTER — Encounter (INDEPENDENT_AMBULATORY_CARE_PROVIDER_SITE_OTHER): Payer: Self-pay | Admitting: Bariatrics

## 2019-05-08 ENCOUNTER — Other Ambulatory Visit (INDEPENDENT_AMBULATORY_CARE_PROVIDER_SITE_OTHER): Payer: Self-pay | Admitting: Bariatrics

## 2019-05-08 DIAGNOSIS — H401131 Primary open-angle glaucoma, bilateral, mild stage: Secondary | ICD-10-CM | POA: Diagnosis not present

## 2019-05-08 DIAGNOSIS — I10 Essential (primary) hypertension: Secondary | ICD-10-CM

## 2019-05-09 MED ORDER — CHLORTHALIDONE 25 MG PO TABS: 25.0000 mg | ORAL_TABLET | Freq: Every day | ORAL | 0 refills | Status: DC

## 2019-05-09 MED FILL — CHLORTHALIDONE 25 MG TABLET: 25 | 30 days supply | Qty: 30 | Fill #0

## 2019-05-11 MED FILL — TRAVOPROST (BAK FREE) 0.004: 0.004 | 25 days supply | Qty: 3 | Fill #3

## 2019-05-13 ENCOUNTER — Encounter (INDEPENDENT_AMBULATORY_CARE_PROVIDER_SITE_OTHER): Payer: Self-pay | Admitting: Bariatrics

## 2019-05-17 ENCOUNTER — Ambulatory Visit (INDEPENDENT_AMBULATORY_CARE_PROVIDER_SITE_OTHER): Payer: 59 | Admitting: Bariatrics

## 2019-05-17 ENCOUNTER — Encounter (INDEPENDENT_AMBULATORY_CARE_PROVIDER_SITE_OTHER): Payer: Self-pay | Admitting: Bariatrics

## 2019-05-17 ENCOUNTER — Other Ambulatory Visit: Payer: Self-pay

## 2019-05-17 VITALS — BP 112/75 | HR 60 | Temp 98.3°F | Ht 65.0 in | Wt 262.0 lb

## 2019-05-17 DIAGNOSIS — E559 Vitamin D deficiency, unspecified: Secondary | ICD-10-CM | POA: Diagnosis not present

## 2019-05-17 DIAGNOSIS — E119 Type 2 diabetes mellitus without complications: Secondary | ICD-10-CM

## 2019-05-17 DIAGNOSIS — E66813 Obesity, class 3: Secondary | ICD-10-CM

## 2019-05-17 DIAGNOSIS — Z6841 Body Mass Index (BMI) 40.0 and over, adult: Secondary | ICD-10-CM

## 2019-05-17 DIAGNOSIS — Z9189 Other specified personal risk factors, not elsewhere classified: Secondary | ICD-10-CM

## 2019-05-18 LAB — COMPREHENSIVE METABOLIC PANEL
ALT: 21 IU/L (ref 0–32)
AST: 19 IU/L (ref 0–40)
Albumin/Globulin Ratio: 1.8 (ref 1.2–2.2)
Albumin: 4.6 g/dL (ref 3.8–4.9)
Alkaline Phosphatase: 91 IU/L (ref 39–117)
BUN/Creatinine Ratio: 24 — ABNORMAL HIGH (ref 9–23)
BUN: 25 mg/dL — ABNORMAL HIGH (ref 6–24)
Bilirubin Total: 0.2 mg/dL (ref 0.0–1.2)
CO2: 25 mmol/L (ref 20–29)
Calcium: 9.7 mg/dL (ref 8.7–10.2)
Chloride: 99 mmol/L (ref 96–106)
Creatinine, Ser: 1.06 mg/dL — ABNORMAL HIGH (ref 0.57–1.00)
GFR calc Af Amer: 68 mL/min/{1.73_m2} (ref >59)
GFR calc non Af Amer: 59 mL/min/{1.73_m2} — ABNORMAL LOW (ref >59)
Globulin, Total: 2.5 g/dL (ref 1.5–4.5)
Glucose: 101 mg/dL — ABNORMAL HIGH (ref 65–99)
Potassium: 4.8 mmol/L (ref 3.5–5.2)
Sodium: 140 mmol/L (ref 134–144)
Total Protein: 7.1 g/dL (ref 6.0–8.5)

## 2019-05-18 LAB — LIPID PANEL WITH LDL/HDL RATIO
Cholesterol, Total: 206 mg/dL — ABNORMAL HIGH (ref 100–199)
HDL: 47 mg/dL (ref >39)
LDL Calculated: 144 mg/dL — ABNORMAL HIGH (ref 0–99)
LDl/HDL Ratio: 3.1 ratio (ref 0.0–3.2)
Triglycerides: 73 mg/dL (ref 0–149)
VLDL Cholesterol Cal: 15 mg/dL (ref 5–40)

## 2019-05-18 LAB — HEMOGLOBIN A1C
Est. average glucose Bld gHb Est-mCnc: 137 mg/dL
Hgb A1c MFr Bld: 6.4 % — ABNORMAL HIGH (ref 4.8–5.6)

## 2019-05-18 LAB — VITAMIN D 25 HYDROXY (VIT D DEFICIENCY, FRACTURES): Vit D, 25-Hydroxy: 67.2 ng/mL (ref 30.0–100.0)

## 2019-05-22 NOTE — Progress Notes (Signed)
Office: [phone]  /  Fax: [phone]   HPI:   Chief Complaint: OBESITY Hannah Gibson is here to discuss her progress with her obesity treatment plan. She is on the Category 2 plan and is following her eating plan approximately 75 % of the time. She states she is walking on the treadmill  15 to 20 minutes 1 to 2 times per week. Hannah Gibson is up 1 pound. She is struggling slightly with water and protein. Her weight is 262 lb (118.8 kg) today and has had a weight gain of 1 pound over a period of 2 weeks since her last visit. She has lost 19 lbs since starting treatment with [name].  Diabetes II Hannah Gibson has a diagnosis of diabetes type II. She is taking metformin. Hannah Gibson does not check her blood sugars. She has been working on intensive lifestyle modifications including diet, exercise, and weight loss to help control her blood glucose levels.  Vitamin D deficiency Hannah Gibson has a diagnosis of vitamin D deficiency. Hannah Gibson is currently taking vit D and she denies nausea, vomiting or muscle weakness.  At risk for osteopenia and osteoporosis Hannah Gibson is at higher risk of osteopenia and osteoporosis due to vitamin D deficiency.   ASSESSMENT AND PLAN:  Type 2 diabetes mellitus without complication, without long-term current use of insulin (HCC) - Plan: Hemoglobin A1c, Lipid Panel With LDL/HDL Ratio, Comprehensive metabolic panel  Vitamin D deficiency - Plan: VITAMIN D 25 Hydroxy (Vit-D Deficiency, Fractures)  At risk for osteoporosis  Class 3 severe obesity with serious comorbidity and body mass index (BMI) of 40.0 to 44.9 in adult, unspecified obesity type ([place])  PLAN:  Diabetes II Hannah Gibson has been given extensive diabetes education by myself today including ideal fasting and post-prandial blood glucose readings, individual ideal Hgb A1c goals and hypoglycemia prevention. We discussed the importance of good blood sugar control to decrease the likelihood of diabetic complications  such as nephropathy, neuropathy, limb loss, blindness, coronary artery disease, and death. We discussed the importance of intensive lifestyle modification including diet, exercise and weight loss as the first line treatment for diabetes. Hannah Gibson agrees to continue her diabetes medications and will follow up at the agreed upon time.  Vitamin D Deficiency Hannah Gibson was informed that low vitamin D levels contributes to fatigue and are associated with obesity, breast, and colon cancer. She agrees to continue to take prescription Vit D @50 ,000 IU every week and will follow up for routine testing of vitamin D, at least 2-3 times per year. She was informed of the risk of over-replacement of vitamin D and agrees to not increase her dose unless she discusses this with [name] first.  At risk for osteopenia and osteoporosis Hannah Gibson was given extended  (15 minutes) osteoporosis prevention counseling today. Hannah Gibson is at risk for osteopenia and osteoporosis due to her vitamin D deficiency. She was encouraged to take her vitamin D and follow her higher calcium diet and increase strengthening exercise to help strengthen her bones and decrease her risk of osteopenia and osteoporosis.  Obesity Hannah Gibson is currently in the action stage of change. As such, her goal is to continue with weight loss efforts She has agreed to follow the Category 2 plan Hannah Gibson will continue walking on the treadmill for weight loss and overall health benefits. We discussed the following Behavioral Modification Strategies today: increase H2O intake, no skipping meals, keeping healthy foods in the home, increasing lean protein intake, decreasing simple carbohydrates, increasing vegetables, decrease eating out and work on meal planning and intentional  eating Hannah Gibson will eat her protein first.  Hannah Gibson has agreed to follow up with our clinic in 2 weeks. She was informed of the importance of frequent follow up visits to maximize her  success with intensive lifestyle modifications for her multiple health conditions.  ALLERGIES: No Known Allergies  MEDICATIONS: Current Outpatient Medications on File Prior to Visit  Medication Sig Dispense Refill  . budesonide-formoterol (SYMBICORT) 160-4.5 MCG/ACT inhaler Inhale 2 puffs into the lungs 2 (two) times daily. 1 Inhaler 5  . chlorthalidone (HYGROTON) 25 MG tablet Take 1 tablet (25 mg total) by mouth daily. 30 tablet 0  . DUEXIS 800-26.6 MG TABS Take 1 tablet by mouth 3 (three) times daily.  6  . metFORMIN (GLUCOPHAGE) 500 MG tablet Take 1 tablet (500 mg total) by mouth 2 (two) times daily. 60 tablet 0  . montelukast (SINGULAIR) 10 MG tablet TAKE 1 TABLET BY MOUTH EVERY NIGHT AT BEDTIME 30 tablet 2  . Multiple Vitamin (MULTIVITAMIN) tablet Take 1 tablet by mouth daily.    . TRAVATAN Z 0.004 % SOLN ophthalmic solution   3  . Vitamin D, Ergocalciferol, (DRISDOL) 1.25 MG (50000 UT) CAPS capsule Take 1 capsule (50,000 Units total) by mouth every 7 (seven) days. 4 capsule 0   No current facility-administered medications on file prior to visit.     PAST MEDICAL HISTORY: Past Medical History:  Diagnosis Date  . Asthma   . Back pain   . HSV infection   . Joint pain   . Lactose intolerance   . Sciatica     PAST SURGICAL HISTORY: Past Surgical History:  Procedure Laterality Date  . MYOMECTOMY  1998  . TOTAL ABDOMINAL HYSTERECTOMY W/ BILATERAL SALPINGOOPHORECTOMY  12/03/1997   secondary to fibroids    SOCIAL HISTORY: Social History   Tobacco Use  . Smoking status: Never Smoker  . Smokeless tobacco: Never Used  Substance Use Topics  . Alcohol use: Yes    Alcohol/week: 5.0 standard drinks    Types: 5 Standard drinks or equivalent per week  . Drug use: No    FAMILY HISTORY: Family History  Problem Relation Age of Onset  . Diabetes Mother   . Hypertension Mother   . Kidney disease Mother   . Glaucoma Mother   . Obesity Mother   . Glaucoma Sister   . Diabetes  Maternal Aunt   . Allergic rhinitis Neg Hx   . Asthma Neg Hx   . Eczema Neg Hx   . Urticaria Neg Hx     ROS: Review of Systems  Constitutional: Negative for weight loss.  Gastrointestinal: Negative for nausea and vomiting.  Musculoskeletal:       Negative for muscle weakness    PHYSICAL EXAM: Blood pressure 112/75, pulse 60, temperature 98.3 F (36.8 C), temperature source Oral, height 5\' 5"  (1.651 m), weight 262 lb (118.8 kg), last menstrual period 12/09/1997, SpO2 100 %. Body mass index is 43.6 kg/m. Physical Exam Vitals signs reviewed.  Constitutional:      Appearance: Normal appearance. She is well-developed. She is obese.  Cardiovascular:     Rate and Rhythm: Normal rate.  Pulmonary:     Effort: Pulmonary effort is normal.  Musculoskeletal: Normal range of motion.  Skin:    General: Skin is warm and dry.  Neurological:     Mental Status: She is alert and oriented to person, place, and time.  Psychiatric:        Mood and Affect: Mood normal.  Behavior: Behavior normal.     RECENT LABS AND TESTS: BMET    Component Value Date/Time   NA 140 05/17/2019 1101   K 4.8 05/17/2019 1101   CL 99 05/17/2019 1101   CO2 25 05/17/2019 1101   GLUCOSE 101 (H) 05/17/2019 1101   BUN 25 (H) 05/17/2019 1101   CREATININE 1.06 (H) 05/17/2019 1101   CALCIUM 9.7 05/17/2019 1101   GFRNONAA 59 (L) 05/17/2019 1101   GFRAA 68 05/17/2019 1101   Lab Results  Component Value Date   HGBA1C 6.4 (H) 05/17/2019   HGBA1C 6.6 (H) 10/11/2018   Lab Results  Component Value Date   INSULIN 11.5 10/11/2018   CBC    Component Value Date/Time   WBC 5.9 07/28/2018 1549   RBC 4.89 07/28/2018 1549   HGB 14.0 07/28/2018 1549   HGB 14.1 12/19/2012 1603   HCT 43.0 07/28/2018 1549   MCV 88 07/28/2018 1549   MCH 28.6 07/28/2018 1549   MCHC 32.6 07/28/2018 1549   RDW 12.2 (L) 07/28/2018 1549   LYMPHSABS 1.9 07/28/2018 1549   EOSABS 0.5 (H) 07/28/2018 1549   BASOSABS 0.0 07/28/2018  1549   Iron/TIBC/Ferritin/ %Sat No results found for: IRON, TIBC, FERRITIN, IRONPCTSAT Lipid Panel     Component Value Date/Time   CHOL 206 (H) 05/17/2019 1101   TRIG 73 05/17/2019 1101   HDL 47 05/17/2019 1101   CHOLHDL 3.7 10/11/2018 1301   LDLCALC 144 (H) 05/17/2019 1101   Hepatic Function Panel     Component Value Date/Time   PROT 7.1 05/17/2019 1101   ALBUMIN 4.6 05/17/2019 1101   AST 19 05/17/2019 1101   ALT 21 05/17/2019 1101   ALKPHOS 91 05/17/2019 1101   BILITOT 0.2 05/17/2019 1101      Component Value Date/Time   TSH 1.880 10/11/2018 1301     Ref. Range 10/11/2018 13:01  Vitamin D, 25-Hydroxy Latest Ref Range: 30.0 - 100.0 ng/mL 31.0    OBESITY BEHAVIORAL INTERVENTION VISIT  Today's visit was # 11   Starting weight: 281 lbs Starting date: 10/11/2018 Today's weight : 262 lbs  Today's date: 05/17/2019 Total lbs lost to date: 19    05/17/2019  Height 5\' 5"  (1.651 m)  Weight 262 lb (118.8 kg)  BMI (Calculated) 43.6  BLOOD PRESSURE - SYSTOLIC [id number]  BLOOD PRESSURE - DIASTOLIC 75   Body Fat % 123XX123 %  Total Body Water (lbs) 84 lbs    ASK: We discussed the diagnosis of obesity with [name] today and Elisia agreed to give [name] permission to discuss obesity behavioral modification therapy today.  ASSESS: Anirah has the diagnosis of obesity and her BMI today is 43.6 Clarabella is in the action stage of change   ADVISE: Candase was educated on the multiple health risks of obesity as well as the benefit of weight loss to improve her health. She was advised of the need for long term treatment and the importance of lifestyle modifications to improve her current health and to decrease her risk of future health problems.  AGREE: Multiple dietary modification options and treatment options were discussed and  Astra agreed to follow the recommendations documented in the above note.  ARRANGE: Kip was educated on the importance of frequent  visits to treat obesity as outlined per CMS and USPSTF guidelines and agreed to schedule her next follow up appointment today.  Corey Skains, am acting as Location manager for [employer]. Owens Shark, DO  I have reviewed the above documentation for  accuracy and completeness, and I agree with the above. -Jearld Lesch, DO

## 2019-05-23 ENCOUNTER — Encounter (INDEPENDENT_AMBULATORY_CARE_PROVIDER_SITE_OTHER): Payer: Self-pay | Admitting: Bariatrics

## 2019-05-29 MED FILL — MONTELUKAST SOD 10 MG TAB: 10 | 30 days supply | Qty: 30 | Fill #2

## 2019-05-31 ENCOUNTER — Ambulatory Visit (INDEPENDENT_AMBULATORY_CARE_PROVIDER_SITE_OTHER): Payer: 59 | Admitting: Bariatrics

## 2019-05-31 ENCOUNTER — Other Ambulatory Visit: Payer: Self-pay

## 2019-05-31 ENCOUNTER — Encounter (INDEPENDENT_AMBULATORY_CARE_PROVIDER_SITE_OTHER): Payer: Self-pay | Admitting: Bariatrics

## 2019-05-31 VITALS — BP 103/72 | HR 64 | Temp 97.9°F | Ht 65.0 in | Wt 258.0 lb

## 2019-05-31 DIAGNOSIS — I10 Essential (primary) hypertension: Secondary | ICD-10-CM

## 2019-05-31 DIAGNOSIS — E559 Vitamin D deficiency, unspecified: Secondary | ICD-10-CM

## 2019-05-31 DIAGNOSIS — Z6841 Body Mass Index (BMI) 40.0 and over, adult: Secondary | ICD-10-CM

## 2019-05-31 DIAGNOSIS — Z9189 Other specified personal risk factors, not elsewhere classified: Secondary | ICD-10-CM

## 2019-05-31 MED ORDER — VITAMIN D (ERGOCALCIFEROL) 1.25 MG (50000 UNIT) PO CAPS: 50000.0000 [IU] | ORAL_CAPSULE | ORAL | 0 refills | Status: DC

## 2019-05-31 MED ORDER — CHLORTHALIDONE 25 MG PO TABS: 25.0000 mg | ORAL_TABLET | Freq: Every day | ORAL | 0 refills | Status: DC

## 2019-05-31 MED FILL — VIT D2 1.25 MG (50,000 UNIT: 1.25 MG | 28 days supply | Qty: 4 | Fill #0

## 2019-05-31 NOTE — Progress Notes (Signed)
Office: [phone]  /  Fax: [phone]   HPI:   Chief Complaint: OBESITY Hannah Gibson is here to discuss her progress with her obesity treatment plan. Hannah Gibson is on the Category 2 plan and is following her eating plan approximately 92% of the time. Hannah Gibson states Hannah Gibson is walking 12 minutes 3-4 times per week. Hannah Gibson is down 4 lbs. Hannah Gibson is doing well with her water intake (new bottle) and is doing well with her protein. Hannah Gibson denies stress eating.  Her weight is 258 lb (117 kg) today and has had a weight loss of 4 pounds over a period of 2 weeks since her last visit. Hannah Gibson has lost 23 lbs since starting treatment with [name].  Hypertension Hannah Gibson is a 58 y.o. female with hypertension. Hannah Gibson denies chest pain or shortness of breath on exertion. No lightheadedness. Hannah Gibson is working weight loss to help control her blood pressure with the goal of decreasing her risk of heart attack and stroke. Hannah Gibson's blood pressure is 103/73 today.  Vitamin D deficiency Hannah Gibson has a diagnosis of Vitamin D deficiency. Hannah Gibson is currently taking prescription Vit D and denies nausea, vomiting or muscle weakness.  At risk for osteopenia and osteoporosis Hannah Gibson is at higher risk of osteopenia and osteoporosis due to Vitamin D deficiency.   ASSESSMENT AND PLAN:  Essential hypertension - Plan: chlorthalidone (HYGROTON) 25 MG tablet  Vitamin D deficiency - Plan: Vitamin D, Ergocalciferol, (DRISDOL) 1.25 MG (50000 UT) CAPS capsule  At risk for osteoporosis  Class 3 severe obesity with serious comorbidity and body mass index (BMI) of 40.0 to 44.9 in adult, unspecified obesity type (Hannah Gibson)  PLAN:  Hypertension We discussed sodium restriction, working on healthy weight loss, and a regular exercise program as the means to achieve improved blood pressure control. Hannah Gibson agreed with this plan and agreed to follow up as directed. We will continue to monitor her blood pressure as well as her progress  with the above lifestyle modifications. Hannah Gibson was given a refill on her chlorthalidone 25 mg 1 PO #30 with 0 refills and agrees to follow-up with our clinic in 2 weeks. Hannah Gibson will continue her medications as prescribed and will watch for signs of hypotension as Hannah Gibson continues her lifestyle modifications.  Vitamin D Deficiency Hannah Gibson was informed that low Vitamin D levels contributes to fatigue and are associated with obesity, breast, and colon cancer. Hannah Gibson agrees to continue to take prescription Vit D @ 50,000 IU every week #4 with 0 refills and will follow-up for routine testing of Vitamin D, at least 2-3 times per year. Hannah Gibson was informed of the risk of over-replacement of Vitamin D and agrees to not increase her dose unless Hannah Gibson discusses this with [name] first. Hannah Gibson agrees to follow-up with our clinic in 2 weeks.  At risk for osteopenia and osteoporosis Hannah Gibson was given extended  (15 minutes) osteoporosis prevention counseling today. Hannah Gibson is at risk for osteopenia and osteoporosis due to her Vitamin D deficiency. Hannah Gibson was encouraged to take her Vitamin D and follow her higher calcium diet and increase strengthening exercise to help strengthen her bones and decrease her risk of osteopenia and osteoporosis.  Obesity Hannah Gibson is currently in the action stage of change. As such, her goal is to continue with weight loss efforts. Hannah Gibson has agreed to follow the Category 2 plan. Hannah Gibson will work on meal planning, intentional eating, adhere to the plan, and increasing vegetables. Handout was given on Seasonings. Hannah Gibson has been instructed to start walking slowly  and increase over time for weight loss and overall health benefits. We discussed the following Behavioral Modification Strategies today: increasing lean protein intake, decreasing simple carbohydrates, increasing vegetables, increase H20 intake, decrease eating out, no skipping meals, work on meal planning and easy cooking plans, and  keeping healthy foods in the home.  Hannah Gibson has agreed to follow-up with our clinic in 2 weeks. Hannah Gibson was informed of the importance of frequent follow-up visits to maximize her success with intensive lifestyle modifications for her multiple health conditions.  ALLERGIES: No Known Allergies  MEDICATIONS: Current Outpatient Medications on File Prior to Visit  Medication Sig Dispense Refill  . budesonide-formoterol (SYMBICORT) 160-4.5 MCG/ACT inhaler Inhale 2 puffs into the lungs 2 (two) times daily. 1 Inhaler 5  . DUEXIS 800-26.6 MG TABS Take 1 tablet by mouth 3 (three) times daily.  6  . metFORMIN (GLUCOPHAGE) 500 MG tablet Take 1 tablet (500 mg total) by mouth 2 (two) times daily. 60 tablet 0  . montelukast (SINGULAIR) 10 MG tablet TAKE 1 TABLET BY MOUTH EVERY NIGHT AT BEDTIME 30 tablet 2  . Multiple Vitamin (MULTIVITAMIN) tablet Take 1 tablet by mouth daily.    . TRAVATAN Z 0.004 % SOLN ophthalmic solution   3   No current facility-administered medications on file prior to visit.     PAST MEDICAL HISTORY: Past Medical History:  Diagnosis Date  . Asthma   . Back pain   . HSV infection   . Joint pain   . Lactose intolerance   . Sciatica     PAST SURGICAL HISTORY: Past Surgical History:  Procedure Laterality Date  . MYOMECTOMY  1998  . TOTAL ABDOMINAL HYSTERECTOMY W/ BILATERAL SALPINGOOPHORECTOMY  12/03/1997   secondary to fibroids    SOCIAL HISTORY: Social History   Tobacco Use  . Smoking status: Never Smoker  . Smokeless tobacco: Never Used  Substance Use Topics  . Alcohol use: Yes    Alcohol/week: 5.0 standard drinks    Types: 5 Standard drinks or equivalent per week  . Drug use: No    FAMILY HISTORY: Family History  Problem Relation Age of Onset  . Diabetes Mother   . Hypertension Mother   . Kidney disease Mother   . Glaucoma Mother   . Obesity Mother   . Glaucoma Sister   . Diabetes Maternal Aunt   . Allergic rhinitis Neg Hx   . Asthma Neg Hx   .  Eczema Neg Hx   . Urticaria Neg Hx    ROS: Review of Systems  Respiratory: Negative for shortness of breath.   Cardiovascular: Negative for chest pain.  Gastrointestinal: Negative for nausea and vomiting.  Musculoskeletal:       Negative for muscle weakness.  Neurological:       Negative for lightheadedness.   PHYSICAL EXAM: Blood pressure 103/72, pulse 64, temperature 97.9 F (36.6 C), temperature source Oral, height 5\' 5"  (1.651 m), weight 258 lb (117 kg), last menstrual period 12/09/1997, SpO2 99 %. Body mass index is 42.93 kg/m. Physical Exam Vitals signs reviewed.  Constitutional:      Appearance: Normal appearance. Hannah Gibson is obese.  Cardiovascular:     Rate and Rhythm: Normal rate.     Pulses: Normal pulses.  Pulmonary:     Effort: Pulmonary effort is normal.     Breath sounds: Normal breath sounds.  Musculoskeletal: Normal range of motion.  Skin:    General: Skin is warm and dry.  Neurological:     Mental Status: Hannah Gibson  is alert and oriented to person, place, and time.  Psychiatric:        Behavior: Behavior normal.   RECENT LABS AND TESTS: BMET    Component Value Date/Time   NA 140 05/17/2019 1101   K 4.8 05/17/2019 1101   CL 99 05/17/2019 1101   CO2 25 05/17/2019 1101   GLUCOSE 101 (H) 05/17/2019 1101   BUN 25 (H) 05/17/2019 1101   CREATININE 1.06 (H) 05/17/2019 1101   CALCIUM 9.7 05/17/2019 1101   GFRNONAA 59 (L) 05/17/2019 1101   GFRAA 68 05/17/2019 1101   Lab Results  Component Value Date   HGBA1C 6.4 (H) 05/17/2019   HGBA1C 6.6 (H) 10/11/2018   Lab Results  Component Value Date   INSULIN 11.5 10/11/2018   CBC    Component Value Date/Time   WBC 5.9 07/28/2018 1549   RBC 4.89 07/28/2018 1549   HGB 14.0 07/28/2018 1549   HGB 14.1 12/19/2012 1603   HCT 43.0 07/28/2018 1549   MCV 88 07/28/2018 1549   MCH 28.6 07/28/2018 1549   MCHC 32.6 07/28/2018 1549   RDW 12.2 (L) 07/28/2018 1549   LYMPHSABS 1.9 07/28/2018 1549   EOSABS 0.5 (H) 07/28/2018  1549   BASOSABS 0.0 07/28/2018 1549   Iron/TIBC/Ferritin/ %Sat No results found for: IRON, TIBC, FERRITIN, IRONPCTSAT Lipid Panel     Component Value Date/Time   CHOL 206 (H) 05/17/2019 1101   TRIG 73 05/17/2019 1101   HDL 47 05/17/2019 1101   CHOLHDL 3.7 10/11/2018 1301   LDLCALC 144 (H) 05/17/2019 1101   Hepatic Function Panel     Component Value Date/Time   PROT 7.1 05/17/2019 1101   ALBUMIN 4.6 05/17/2019 1101   AST 19 05/17/2019 1101   ALT 21 05/17/2019 1101   ALKPHOS 91 05/17/2019 1101   BILITOT 0.2 05/17/2019 1101      Component Value Date/Time   TSH 1.880 10/11/2018 1301   Results for ALIYONNA, PYNES A (MRN [id number]) as of 05/31/2019 13:34  Ref. Range 05/17/2019 11:01  Vitamin D, 25-Hydroxy Latest Ref Range: 30.0 - 100.0 ng/mL 67.2   OBESITY BEHAVIORAL INTERVENTION VISIT  Today's visit was #13   Starting weight: 281 lbs Starting date: 10/11/2018 Today's weight: 258 lbs Today's date: 05/31/2019 Total lbs lost to date: 23   05/31/2019  Height 5\' 5"  (1.651 m)  Weight 258 lb (117 kg)  BMI (Calculated) 42.93  BLOOD PRESSURE - SYSTOLIC [id number]  BLOOD PRESSURE - DIASTOLIC 72   Body Fat % [id number] %  Total Body Water (lbs) 81 lbs   ASK: We discussed the diagnosis of obesity with Hannah Gibson today and Hannah Gibson agreed to give [name] permission to discuss obesity behavioral modification therapy today.  ASSESS: Brittini has the diagnosis of obesity and her BMI today is 43.0. Madylyn is in the action stage of change.   ADVISE: Blaiklee was educated on the multiple health risks of obesity as well as the benefit of weight loss to improve her health. Hannah Gibson was advised of the need for long term treatment and the importance of lifestyle modifications to improve her current health and to decrease her risk of future health problems.  AGREE: Multiple dietary modification options and treatment options were discussed and  Tahirih agreed to follow the recommendations  documented in the above note.  ARRANGE: Fe was educated on the importance of frequent visits to treat obesity as outlined per CMS and USPSTF guidelines and agreed to schedule her next follow up appointment today.  Migdalia Dk, am acting as Location manager for [employer], DO  I have reviewed the above documentation for accuracy and completeness, and I agree with the above. -Jearld Lesch, DO

## 2019-06-02 ENCOUNTER — Other Ambulatory Visit (INDEPENDENT_AMBULATORY_CARE_PROVIDER_SITE_OTHER): Payer: Self-pay | Admitting: Bariatrics

## 2019-06-02 DIAGNOSIS — E118 Type 2 diabetes mellitus with unspecified complications: Secondary | ICD-10-CM

## 2019-06-02 MED FILL — CHLORTHALIDONE 25 MG TABS: 25 | 30 days supply | Qty: 30 | Fill #0

## 2019-06-02 MED FILL — SYMBICORT 160-4.5 MCG INH: 160-4.5 | 30 days supply | Qty: 10 | Fill #3

## 2019-06-05 MED ORDER — METFORMIN HCL 500 MG PO TABS: 500.0000 mg | ORAL_TABLET | Freq: Two times a day (BID) | ORAL | 0 refills | Status: DC

## 2019-06-05 MED FILL — metFORMIN HCL 500 MG TABS: 500 | 30 days supply | Qty: 60 | Fill #0

## 2019-06-07 DIAGNOSIS — M13842 Other specified arthritis, left hand: Secondary | ICD-10-CM | POA: Diagnosis not present

## 2019-06-07 DIAGNOSIS — M79645 Pain in left finger(s): Secondary | ICD-10-CM | POA: Diagnosis not present

## 2019-06-07 DIAGNOSIS — M25542 Pain in joints of left hand: Secondary | ICD-10-CM | POA: Insufficient documentation

## 2019-06-14 ENCOUNTER — Ambulatory Visit (INDEPENDENT_AMBULATORY_CARE_PROVIDER_SITE_OTHER): Payer: 59 | Admitting: Family Medicine

## 2019-06-18 ENCOUNTER — Ambulatory Visit (INDEPENDENT_AMBULATORY_CARE_PROVIDER_SITE_OTHER): Payer: 59 | Admitting: Family Medicine

## 2019-06-18 ENCOUNTER — Other Ambulatory Visit: Payer: Self-pay

## 2019-06-18 VITALS — BP 105/71 | HR 76 | Temp 97.9°F | Ht 65.0 in | Wt 258.8 lb

## 2019-06-18 DIAGNOSIS — Z9189 Other specified personal risk factors, not elsewhere classified: Secondary | ICD-10-CM | POA: Diagnosis not present

## 2019-06-18 DIAGNOSIS — E559 Vitamin D deficiency, unspecified: Secondary | ICD-10-CM | POA: Diagnosis not present

## 2019-06-18 DIAGNOSIS — E7849 Other hyperlipidemia: Secondary | ICD-10-CM

## 2019-06-18 DIAGNOSIS — Z6841 Body Mass Index (BMI) 40.0 and over, adult: Secondary | ICD-10-CM

## 2019-06-18 MED ORDER — VITAMIN D (ERGOCALCIFEROL) 1.25 MG (50000 UNIT) PO CAPS: 50000.0000 [IU] | ORAL_CAPSULE | ORAL | 0 refills | Status: DC

## 2019-06-18 MED ORDER — ATORVASTATIN CALCIUM 10 MG PO TABS: 10.0000 mg | ORAL_TABLET | Freq: Every day | ORAL | 0 refills | Status: DC

## 2019-06-18 MED FILL — ATORVASTATIN 10 MG TABLET: 10 | 30 days supply | Qty: 30 | Fill #0

## 2019-06-20 NOTE — Progress Notes (Signed)
Office: [phone]  /  Fax: [phone]   HPI:   Chief Complaint: OBESITY Hannah Gibson is here to discuss her progress with her obesity treatment plan. She is on the Category 2 plan and is following her eating plan approximately 75 % of the time. She states she is walking 30 minutes 3 times per week. Hannah Gibson is eating all of the protein on the plan. She has been slightly off of the plan the last few weeks. She has not been weighing her protein. Her weight is 258 lb 12.8 oz (117.4 kg) today and she has maintained weight since her last visit. She has lost 23 lbs since starting treatment with [name].  Vitamin D deficiency Hannah Gibson has a diagnosis of vitamin D deficiency. She is currently taking vit D and her last vitamin D level was at 67.2 on 05/17/19 and was at goal. She denies nausea, vomiting or muscle weakness.  Hyperlipidemia Hannah Gibson has hyperlipidemia and has been trying to improve her cholesterol levels with intensive lifestyle modification including a low saturated fat diet, exercise and weight loss. She is not currently on a statin.  She denies any chest pain or shortness of breath. The 10-year ASCVD risk score Hannah Bussing DC [name], Hannah al., Hannah Gibson) is: 7%   Values used to calculate the score:     Age: 58 years     Sex: Female     Is Non-Hispanic African American: Yes     Diabetic: Yes     Tobacco smoker: No     Systolic Blood Pressure: 123456 mmHg     Is BP treated: Yes     HDL Cholesterol: 47 mg/dL     Total Cholesterol: 206 mg/dL   At risk for cardiovascular disease Hannah Gibson is at a higher than average risk for cardiovascular disease due to obesity and hyperlipidemia. She currently denies any chest pain.  ASSESSMENT AND PLAN:  Vitamin D deficiency - Plan: Vitamin D, Ergocalciferol, (DRISDOL) 1.25 MG (50000 UT) CAPS capsule  Other hyperlipidemia - Plan: atorvastatin (LIPITOR) 10 MG tablet  At risk for heart disease  Class 3 severe obesity with serious comorbidity and body mass  index (BMI) of 40.0 to 44.9 in adult, unspecified obesity type ([place])  PLAN:  Vitamin D Deficiency Hannah Gibson was informed that low vitamin D levels contributes to fatigue and are associated with obesity, breast, and colon cancer. Hannah Gibson agrees to continue prescription Vit D @50 ,000 IU and change her dose to every 14 days #6 with no refills. She will follow up for routine testing of vitamin D, at least 2-3 times per year. She was informed of the risk of over-replacement of vitamin D and agrees to not increase her dose unless she discusses this with [name] first. Hannah Gibson agrees to follow up with our clinic in 2 weeks.  Hyperlipidemia Hannah Gibson was informed of the American Heart Association Guidelines emphasizing intensive lifestyle modifications as the first line treatment for hyperlipidemia. We discussed many lifestyle modifications today in depth, and Hannah Gibson will continue to work on decreasing saturated fats such as fatty red meat, butter and many fried foods. She will also increase vegetables and lean protein in her diet and continue to work on exercise and weight loss efforts. Hannah Gibson agrees to start Lipitor 10 mg once daily #30 with no refills and follow up as directed.  Cardiovascular risk counseling Hannah Gibson was given extended (15 minutes) coronary artery disease prevention counseling today. She is 58 y.o. female and has risk factors for heart disease including obesity and hyperlipidemia. We  discussed intensive lifestyle modifications today with an emphasis on specific weight loss instructions and strategies. Pt was also informed of the importance of increasing exercise and decreasing saturated fats to help prevent heart disease.  Obesity Hannah Gibson is currently in the action stage of change. As such, her goal is to continue with weight loss efforts She has agreed to follow the Category 2 plan Hannah Gibson will increase walking to 40 minutes, 4 times per week for weight loss and overall  health benefits. We discussed adding resistance training twice weekly.  We discussed the following Behavioral Modification Strategies today: planning for success and increasing lean protein intake  Hannah Gibson will weigh all of her meat.  Hannah Gibson has agreed to follow up with our clinic in 2 weeks. She was informed of the importance of frequent follow up visits to maximize her success with intensive lifestyle modifications for her multiple health conditions.  ALLERGIES: No Known Allergies  MEDICATIONS: Current Outpatient Medications on File Prior to Visit  Medication Sig Dispense Refill  . budesonide-formoterol (SYMBICORT) 160-4.5 MCG/ACT inhaler Inhale 2 puffs into the lungs 2 (two) times daily. 1 Inhaler 5  . chlorthalidone (HYGROTON) 25 MG tablet Take 1 tablet (25 mg total) by mouth daily. 30 tablet 0  . DUEXIS 800-26.6 MG TABS Take 1 tablet by mouth 3 (three) times daily.  6  . metFORMIN (GLUCOPHAGE) 500 MG tablet Take 1 tablet (500 mg total) by mouth 2 (two) times daily. 60 tablet 0  . montelukast (SINGULAIR) 10 MG tablet TAKE 1 TABLET BY MOUTH EVERY NIGHT AT BEDTIME 30 tablet 2  . Multiple Vitamin (MULTIVITAMIN) tablet Take 1 tablet by mouth daily.    . TRAVATAN Z 0.004 % SOLN ophthalmic solution   3   No current facility-administered medications on file prior to visit.     PAST MEDICAL HISTORY: Past Medical History:  Diagnosis Date  . Asthma   . Back pain   . HSV infection   . Joint pain   . Lactose intolerance   . Sciatica     PAST SURGICAL HISTORY: Past Surgical History:  Procedure Laterality Date  . MYOMECTOMY  1998  . TOTAL ABDOMINAL HYSTERECTOMY W/ BILATERAL SALPINGOOPHORECTOMY  12/03/1997   secondary to fibroids    SOCIAL HISTORY: Social History   Tobacco Use  . Smoking status: Never Smoker  . Smokeless tobacco: Never Used  Substance Use Topics  . Alcohol use: Yes    Alcohol/week: 5.0 standard drinks    Types: 5 Standard drinks or equivalent per week  .  Drug use: No    FAMILY HISTORY: Family History  Problem Relation Age of Onset  . Diabetes Mother   . Hypertension Mother   . Kidney disease Mother   . Glaucoma Mother   . Obesity Mother   . Glaucoma Sister   . Diabetes Maternal Aunt   . Allergic rhinitis Neg Hx   . Asthma Neg Hx   . Eczema Neg Hx   . Urticaria Neg Hx     ROS: Review of Systems  Constitutional: Negative for weight loss.  Respiratory: Negative for shortness of breath.   Cardiovascular: Negative for chest pain.  Gastrointestinal: Negative for nausea and vomiting.  Musculoskeletal:       Negative for muscle weakness    PHYSICAL EXAM: Blood pressure 105/71, pulse 76, temperature 97.9 F (36.6 C), temperature source Oral, height 5\' 5"  (1.651 m), weight 258 lb 12.8 oz (117.4 kg), last menstrual period 12/09/1997, SpO2 98 %. Body mass index is 43.07  kg/m. Physical Exam Vitals signs reviewed.  Constitutional:      Appearance: Normal appearance. She is well-developed. She is obese.  Cardiovascular:     Rate and Rhythm: Normal rate.  Pulmonary:     Effort: Pulmonary effort is normal.  Musculoskeletal: Normal range of motion.  Skin:    General: Skin is warm and dry.  Neurological:     Mental Status: She is alert and oriented to person, place, and time.  Psychiatric:        Mood and Affect: Mood normal.        Behavior: Behavior normal.     RECENT LABS AND TESTS: BMET    Component Value Date/Time   NA 140 05/17/2019 1101   K 4.8 05/17/2019 1101   CL 99 05/17/2019 1101   CO2 25 05/17/2019 1101   GLUCOSE 101 (H) 05/17/2019 1101   BUN 25 (H) 05/17/2019 1101   CREATININE 1.06 (H) 05/17/2019 1101   CALCIUM 9.7 05/17/2019 1101   GFRNONAA 59 (L) 05/17/2019 1101   GFRAA 68 05/17/2019 1101   Lab Results  Component Value Date   HGBA1C 6.4 (H) 05/17/2019   HGBA1C 6.6 (H) 10/11/2018   Lab Results  Component Value Date   INSULIN 11.5 10/11/2018   CBC    Component Value Date/Time   WBC 5.9  07/28/2018 1549   RBC 4.89 07/28/2018 1549   HGB 14.0 07/28/2018 1549   HGB 14.1 12/19/2012 1603   HCT 43.0 07/28/2018 1549   MCV 88 07/28/2018 1549   MCH 28.6 07/28/2018 1549   MCHC 32.6 07/28/2018 1549   RDW 12.2 (L) 07/28/2018 1549   LYMPHSABS 1.9 07/28/2018 1549   EOSABS 0.5 (H) 07/28/2018 1549   BASOSABS 0.0 07/28/2018 1549   Iron/TIBC/Ferritin/ %Sat No results found for: IRON, TIBC, FERRITIN, IRONPCTSAT Lipid Panel     Component Value Date/Time   CHOL 206 (H) 05/17/2019 1101   TRIG 73 05/17/2019 1101   HDL 47 05/17/2019 1101   CHOLHDL 3.7 10/11/2018 1301   LDLCALC 144 (H) 05/17/2019 1101   Hepatic Function Panel     Component Value Date/Time   PROT 7.1 05/17/2019 1101   ALBUMIN 4.6 05/17/2019 1101   AST 19 05/17/2019 1101   ALT 21 05/17/2019 1101   ALKPHOS 91 05/17/2019 1101   BILITOT 0.2 05/17/2019 1101      Component Value Date/Time   TSH 1.880 10/11/2018 1301     Ref. Range 05/17/2019 11:01  Vitamin D, 25-Hydroxy Latest Ref Range: 30.0 - 100.0 ng/mL 67.2    OBESITY BEHAVIORAL INTERVENTION VISIT  Today's visit was # 13   Starting weight: 281 lbs Starting date: 10/11/2018 Today's weight : 258 lbs  Today's date: 06/18/2019 Total lbs lost to date: 23    06/18/2019  Height 5\' 5"  (1.651 m)  Weight 258 lb 12.8 oz (117.4 kg)  BMI (Calculated) 43.07  BLOOD PRESSURE - SYSTOLIC 123456  BLOOD PRESSURE - DIASTOLIC 71   Body Fat % 123456 %  Total Body Water (lbs) 80.8 lbs    ASK: We discussed the diagnosis of obesity with [name] today and Ruhi agreed to give [name] permission to discuss obesity behavioral modification therapy today.  ASSESS: Ilissa has the diagnosis of obesity and her BMI today is 43.07 Sheryn is in the action stage of change   ADVISE: Allegra was educated on the multiple health risks of obesity as well as the benefit of weight loss to improve her health. She was advised of the  need for long term treatment and the  importance of lifestyle modifications to improve her current health and to decrease her risk of future health problems.  AGREE: Multiple dietary modification options and treatment options were discussed and  Garnell agreed to follow the recommendations documented in the above note.  ARRANGE: Aashirya was educated on the importance of frequent visits to treat obesity as outlined per CMS and USPSTF guidelines and agreed to schedule her next follow up appointment today.  I, Hannah Gibson, am acting as transcriptionist for [employer], FNP-C  I have reviewed the above documentation for accuracy and completeness, and I agree with the above.  - Hannah Tant, FNP-C.

## 2019-06-21 ENCOUNTER — Encounter (INDEPENDENT_AMBULATORY_CARE_PROVIDER_SITE_OTHER): Payer: Self-pay | Admitting: Family Medicine

## 2019-06-21 DIAGNOSIS — E7849 Other hyperlipidemia: Secondary | ICD-10-CM | POA: Insufficient documentation

## 2019-07-02 MED FILL — TRAVOPROST (BAK FREE) 0.004: 0.004 | 25 days supply | Qty: 3 | Fill #0

## 2019-07-04 ENCOUNTER — Other Ambulatory Visit: Payer: Self-pay

## 2019-07-04 ENCOUNTER — Encounter (INDEPENDENT_AMBULATORY_CARE_PROVIDER_SITE_OTHER): Payer: Self-pay | Admitting: Family Medicine

## 2019-07-04 ENCOUNTER — Ambulatory Visit (INDEPENDENT_AMBULATORY_CARE_PROVIDER_SITE_OTHER): Payer: 59 | Admitting: Family Medicine

## 2019-07-04 VITALS — BP 106/69 | HR 67 | Temp 98.2°F | Ht 65.0 in | Wt 260.0 lb

## 2019-07-04 DIAGNOSIS — E119 Type 2 diabetes mellitus without complications: Secondary | ICD-10-CM | POA: Diagnosis not present

## 2019-07-04 DIAGNOSIS — Z6841 Body Mass Index (BMI) 40.0 and over, adult: Secondary | ICD-10-CM | POA: Diagnosis not present

## 2019-07-04 DIAGNOSIS — E7849 Other hyperlipidemia: Secondary | ICD-10-CM | POA: Diagnosis not present

## 2019-07-04 MED FILL — VIT D2 1.25 MG (50,000 UNIT: 1.25 MG | 84 days supply | Qty: 6 | Fill #0

## 2019-07-05 NOTE — Progress Notes (Signed)
Office: [phone]  /  Fax: [phone]   HPI:   Chief Complaint: OBESITY Hannah Gibson is here to discuss her progress with her obesity treatment plan. She is on the Category 2 plan and is following her eating plan approximately 80% of the time. She states she is walking 15 minutes 2 times per week. Hannah Gibson is up 2 lbs of water on the scale. She reports eating all protein on the plan. She struggles with getting vegetables in. She is buying lunch on work days but makes good choices. She states she is not eating all of her snack calories.  Her weight is 260 lb (117.9 kg) today and has had a weight gain of 2 lbs since her last visit. She has lost 21 lbs since starting treatment with [name].  Diabetes Mellitus Hannah Gibson has a diagnosis of diabetes mellitus, which is well controlled with metformin. Hannah Gibson does not report checking her blood sugars. Last A1c was 6.4 on 05/17/2019. She is only taking metformin once daily rather than as prescribed.   Hyperlipidemia Hannah Gibson has hyperlipidemia and has been trying to improve her cholesterol levels with intensive lifestyle modification including a low saturated fat diet, exercise and weight loss. Lipitor was started at her last visit and she is tolerating this well. Last LDL was 144 on 05/17/2019; triglycerides within normal limits; HDL low at 47. She denies any chest pain, claudication or myalgias. The 10-year ASCVD risk score Hannah Bussing DC [name], et al., 2013) is: 7.2%   Values used to calculate the score:     Age: 58 years     Sex: Female     Is Non-Hispanic African American: Yes     Diabetic: Yes     Tobacco smoker: No     Systolic Blood Pressure: [id number] mmHg     Is BP treated: Yes     HDL Cholesterol: 47 mg/dL     Total Cholesterol: 206 mg/dL   ASSESSMENT AND PLAN:  Other hyperlipidemia  Type 2 diabetes mellitus without complication, without long-term current use of insulin ([facility])  Class 3 severe obesity with serious comorbidity and body mass  index (BMI) of 40.0 to 44.9 in adult, unspecified obesity type ([place])  PLAN:  Diabetes Mellitus Hannah Gibson has been given extensive diabetes education by myself today including ideal fasting and post-prandial blood glucose readings, individual ideal HgA1c goals  and hypoglycemia prevention. We discussed the importance of good blood sugar control to decrease the likelihood of diabetic complications such as nephropathy, neuropathy, limb loss, blindness, coronary artery disease, and death. We discussed the importance of intensive lifestyle modification including diet, exercise and weight loss as the first line treatment for diabetes. Hannah Gibson may continue metformin once daily. Will reassess metformin dose at her next A1c check.  Hyperlipidemia Hannah Gibson was informed of the American Heart Association Guidelines emphasizing intensive lifestyle modifications as the first line treatment for hyperlipidemia. We discussed many lifestyle modifications today in depth, and [name] will continue to work on decreasing saturated fats such as fatty red meat, butter and many fried foods. Hannah Gibson will continue Lipitor. She will also increase vegetables and lean protein in her diet and continue to work on exercise and weight loss efforts.  Obesity Hannah Gibson is currently in the action stage of change. As such, her goal is to continue with weight loss efforts. She has agreed to follow the Category 2 plan. She was instructed to eat all of her snack calories. Hannah Gibson has been instructed to increase walking for weight loss and overall health benefits.  We discussed the following Behavioral Modification Strategies today: increasing vegetables, better snacking choices, and planning for success.  Hannah Gibson has agreed to follow-up with our clinic in 2 weeks. She was informed of the importance of frequent follow-up visits to maximize her success with intensive lifestyle modifications for her multiple health conditions.   ALLERGIES: No Known Allergies  MEDICATIONS: Current Outpatient Medications on File Prior to Visit  Medication Sig Dispense Refill  . atorvastatin (LIPITOR) 10 MG tablet Take 1 tablet (10 mg total) by mouth daily. 30 tablet 0  . budesonide-formoterol (SYMBICORT) 160-4.5 MCG/ACT inhaler Inhale 2 puffs into the lungs 2 (two) times daily. 1 Inhaler 5  . chlorthalidone (HYGROTON) 25 MG tablet Take 1 tablet (25 mg total) by mouth daily. 30 tablet 0  . DUEXIS 800-26.6 MG TABS Take 1 tablet by mouth 3 (three) times daily.  6  . metFORMIN (GLUCOPHAGE) 500 MG tablet Take 1 tablet (500 mg total) by mouth 2 (two) times daily. 60 tablet 0  . montelukast (SINGULAIR) 10 MG tablet TAKE 1 TABLET BY MOUTH EVERY NIGHT AT BEDTIME 30 tablet 2  . Multiple Vitamin (MULTIVITAMIN) tablet Take 1 tablet by mouth daily.    . TRAVATAN Z 0.004 % SOLN ophthalmic solution   3  . Vitamin D, Ergocalciferol, (DRISDOL) 1.25 MG (50000 UT) CAPS capsule Take 1 capsule (50,000 Units total) by mouth every 14 (fourteen) days. 6 capsule 0   No current facility-administered medications on file prior to visit.     PAST MEDICAL HISTORY: Past Medical History:  Diagnosis Date  . Asthma   . Back pain   . HSV infection   . Joint pain   . Lactose intolerance   . Sciatica     PAST SURGICAL HISTORY: Past Surgical History:  Procedure Laterality Date  . MYOMECTOMY  1998  . TOTAL ABDOMINAL HYSTERECTOMY W/ BILATERAL SALPINGOOPHORECTOMY  12/03/1997   secondary to fibroids    SOCIAL HISTORY: Social History   Tobacco Use  . Smoking status: Never Smoker  . Smokeless tobacco: Never Used  Substance Use Topics  . Alcohol use: Yes    Alcohol/week: 5.0 standard drinks    Types: 5 Standard drinks or equivalent per week  . Drug use: No    FAMILY HISTORY: Family History  Problem Relation Age of Onset  . Diabetes Mother   . Hypertension Mother   . Kidney disease Mother   . Glaucoma Mother   . Obesity Mother   . Glaucoma Sister    . Diabetes Maternal Aunt   . Allergic rhinitis Neg Hx   . Asthma Neg Hx   . Eczema Neg Hx   . Urticaria Neg Hx    ROS: Review of Systems  Cardiovascular: Negative for chest pain and claudication.  Musculoskeletal: Negative for myalgias.   PHYSICAL EXAM: Blood pressure 106/69, pulse 67, temperature 98.2 F (36.8 C), temperature source Oral, height 5\' 5"  (1.651 m), weight 260 lb (117.9 kg), last menstrual period 12/09/1997, SpO2 99 %. Body mass index is 43.27 kg/m. Physical Exam Vitals signs reviewed.  Constitutional:      Appearance: Normal appearance. She is obese.  Cardiovascular:     Rate and Rhythm: Normal rate.     Pulses: Normal pulses.  Pulmonary:     Effort: Pulmonary effort is normal.     Breath sounds: Normal breath sounds.  Musculoskeletal: Normal range of motion.  Skin:    General: Skin is warm and dry.  Neurological:     Mental Status: She is  alert and oriented to person, place, and time.  Psychiatric:        Behavior: Behavior normal.   RECENT LABS AND TESTS: BMET    Component Value Date/Time   NA 140 05/17/2019 1101   K 4.8 05/17/2019 1101   CL 99 05/17/2019 1101   CO2 25 05/17/2019 1101   GLUCOSE 101 (H) 05/17/2019 1101   BUN 25 (H) 05/17/2019 1101   CREATININE 1.06 (H) 05/17/2019 1101   CALCIUM 9.7 05/17/2019 1101   GFRNONAA 59 (L) 05/17/2019 1101   GFRAA 68 05/17/2019 1101   Lab Results  Component Value Date   HGBA1C 6.4 (H) 05/17/2019   HGBA1C 6.6 (H) 10/11/2018   Lab Results  Component Value Date   INSULIN 11.5 10/11/2018   CBC    Component Value Date/Time   WBC 5.9 07/28/2018 1549   RBC 4.89 07/28/2018 1549   HGB 14.0 07/28/2018 1549   HGB 14.1 12/19/2012 1603   HCT 43.0 07/28/2018 1549   MCV 88 07/28/2018 1549   MCH 28.6 07/28/2018 1549   MCHC 32.6 07/28/2018 1549   RDW 12.2 (L) 07/28/2018 1549   LYMPHSABS 1.9 07/28/2018 1549   EOSABS 0.5 (H) 07/28/2018 1549   BASOSABS 0.0 07/28/2018 1549   Iron/TIBC/Ferritin/ %Sat No  results found for: IRON, TIBC, FERRITIN, IRONPCTSAT Lipid Panel     Component Value Date/Time   CHOL 206 (H) 05/17/2019 1101   TRIG 73 05/17/2019 1101   HDL 47 05/17/2019 1101   CHOLHDL 3.7 10/11/2018 1301   LDLCALC 144 (H) 05/17/2019 1101   Hepatic Function Panel     Component Value Date/Time   PROT 7.1 05/17/2019 1101   ALBUMIN 4.6 05/17/2019 1101   AST 19 05/17/2019 1101   ALT 21 05/17/2019 1101   ALKPHOS 91 05/17/2019 1101   BILITOT 0.2 05/17/2019 1101      Component Value Date/Time   TSH 1.880 10/11/2018 1301   Results for VERNECIA, LAZER A (MRN [id number]) as of 07/05/2019 09:04  Ref. Range 05/17/2019 11:01  Vitamin D, 25-Hydroxy Latest Ref Range: 30.0 - 100.0 ng/mL 67.2   OBESITY BEHAVIORAL INTERVENTION VISIT  Today's visit was #15  Starting weight: 281 lbs Starting date: 10/11/2018 Today's weight: 260 lbs Today's date: 07/04/2019 Total lbs lost to date: 21    07/04/2019  Height 5\' 5"  (1.651 m)  Weight 260 lb (117.9 kg)  BMI (Calculated) 43.27  BLOOD PRESSURE - SYSTOLIC [id number]  BLOOD PRESSURE - DIASTOLIC 69   Body Fat % A999333 %  Total Body Water (lbs) 82.8 lbs   ASK: We discussed the diagnosis of obesity with [name] today and Danieal agreed to give [name] permission to discuss obesity behavioral modification therapy today.  ASSESS: Mcclain has the diagnosis of obesity and her BMI today is 43.4. Mercer is in the action stage of change.   ADVISE: Jeanelle was educated on the multiple health risks of obesity as well as the benefit of weight loss to improve her health. She was advised of the need for long term treatment and the importance of lifestyle modifications to improve her current health and to decrease her risk of future health problems.  AGREE: Multiple dietary modification options and treatment options were discussed and  Baila agreed to follow the recommendations documented in the above note.  ARRANGE: [place] was educated on the  importance of frequent visits to treat obesity as outlined per CMS and USPSTF guidelines and agreed to schedule her next follow up appointment today.  I,  Michaelene Song, am acting as Location manager for [employer], FNP  I have reviewed the above documentation for accuracy and completeness, and I agree with the above.  - Dawn Whitmire, FNP-C.

## 2019-07-07 ENCOUNTER — Other Ambulatory Visit: Payer: Self-pay | Admitting: Allergy

## 2019-07-07 ENCOUNTER — Other Ambulatory Visit (INDEPENDENT_AMBULATORY_CARE_PROVIDER_SITE_OTHER): Payer: Self-pay | Admitting: Bariatrics

## 2019-07-07 DIAGNOSIS — I10 Essential (primary) hypertension: Secondary | ICD-10-CM

## 2019-07-09 MED ORDER — CHLORTHALIDONE 25 MG PO TABS: 25.0000 mg | ORAL_TABLET | Freq: Every day | ORAL | 0 refills | Status: DC

## 2019-07-09 MED FILL — CHLORTHALIDONE 25 MG TABS: 25 | 14 days supply | Qty: 14 | Fill #0

## 2019-07-10 ENCOUNTER — Other Ambulatory Visit: Payer: Self-pay | Admitting: *Deleted

## 2019-07-19 ENCOUNTER — Encounter (INDEPENDENT_AMBULATORY_CARE_PROVIDER_SITE_OTHER): Payer: Self-pay | Admitting: Family Medicine

## 2019-07-19 ENCOUNTER — Other Ambulatory Visit: Payer: Self-pay

## 2019-07-19 ENCOUNTER — Other Ambulatory Visit: Payer: Self-pay | Admitting: Allergy

## 2019-07-19 ENCOUNTER — Ambulatory Visit (INDEPENDENT_AMBULATORY_CARE_PROVIDER_SITE_OTHER): Payer: 59 | Admitting: Family Medicine

## 2019-07-19 VITALS — BP 112/69 | HR 54 | Temp 98.5°F | Ht 65.0 in | Wt 256.0 lb

## 2019-07-19 DIAGNOSIS — I10 Essential (primary) hypertension: Secondary | ICD-10-CM

## 2019-07-19 DIAGNOSIS — Z6841 Body Mass Index (BMI) 40.0 and over, adult: Secondary | ICD-10-CM | POA: Diagnosis not present

## 2019-07-19 DIAGNOSIS — Z9189 Other specified personal risk factors, not elsewhere classified: Secondary | ICD-10-CM

## 2019-07-19 DIAGNOSIS — E119 Type 2 diabetes mellitus without complications: Secondary | ICD-10-CM | POA: Diagnosis not present

## 2019-07-19 MED ORDER — CHLORTHALIDONE 25 MG PO TABS: 25.0000 mg | ORAL_TABLET | Freq: Every day | ORAL | 0 refills | Status: DC

## 2019-07-19 MED FILL — CHLORTHALIDONE 25 MG TABS: 25 | 30 days supply | Qty: 30 | Fill #0

## 2019-07-20 DIAGNOSIS — Z20828 Contact with and (suspected) exposure to other viral communicable diseases: Secondary | ICD-10-CM | POA: Diagnosis not present

## 2019-07-23 ENCOUNTER — Telehealth (INDEPENDENT_AMBULATORY_CARE_PROVIDER_SITE_OTHER): Payer: Self-pay

## 2019-07-23 ENCOUNTER — Other Ambulatory Visit (INDEPENDENT_AMBULATORY_CARE_PROVIDER_SITE_OTHER): Payer: Self-pay | Admitting: Family Medicine

## 2019-07-23 DIAGNOSIS — E7849 Other hyperlipidemia: Secondary | ICD-10-CM

## 2019-07-23 MED ORDER — ATORVASTATIN CALCIUM 10 MG PO TABS: 10.0000 mg | ORAL_TABLET | Freq: Every day | ORAL | 0 refills | Status: DC

## 2019-07-23 MED FILL — ATORVASTATIN 10 MG TABLET: 10 | 14 days supply | Qty: 14 | Fill #0

## 2019-07-23 NOTE — Telephone Encounter (Signed)
[place] FNP approved refill request for 14 pills for atorvastatin. 30 day supply was sent in call placed to pharmacy to change to #14 spoke with Lorre Nick at [facility] outpatient pharmacy

## 2019-07-24 NOTE — Progress Notes (Signed)
Office: [phone]  /  Fax: [phone]   HPI:   Chief Complaint: OBESITY Hannah Gibson is here to discuss her progress with her obesity treatment plan. She is on the Category 2 plan and is following her eating plan approximately 80 % of the time. She states she is exercising 0 minutes 0 times per week. [name] has been eating more vegetables and she has been trying to eat all of her snack calories. She has had left heel pain and she has been unable to exercise. Her weight is 256 lb (116.1 kg) today and has had a weight loss of 4 pounds over a period of 2 weeks since her last visit. She has lost 25 lbs since starting treatment with [name].  Hypertension BAILLEY Gibson is a 58 y.o. female with hypertension.Horace's hypertension is well controlled.  Hannah Gibson denies chest pain or shortness of breath on exertion. She is working weight loss to help control her blood pressure with the goal of decreasing her risk of heart attack and stroke. BP Readings from Last 3 Encounters:  07/19/19 112/69  07/04/19 106/69  06/18/19 105/71     Diabetes II Hannah Gibson has a diagnosis of diabetes type II. She is on metformin two times daily. Her diabetes is well controlled. She has been working on intensive lifestyle modifications including diet, exercise, and weight loss to help control her blood glucose levels. Hannah Gibson denies polyphagia. Lab Results  Component Value Date   HGBA1C 6.4 (H) 05/17/2019    At risk for cardiovascular disease Hannah Gibson is at a higher than average risk for cardiovascular disease due to obesity, hypertension and diabetes. She currently denies any chest pain.  ASSESSMENT AND PLAN:  Essential hypertension - Plan: chlorthalidone (HYGROTON) 25 MG tablet  Type 2 diabetes mellitus without complication, without long-term current use of insulin (HCC)  At risk for heart disease  Class 3 severe obesity with serious comorbidity and body mass index (BMI) of 40.0 to 44.9 in  adult, unspecified obesity type ([place])  PLAN:  Hypertension We discussed sodium restriction, working on healthy weight loss, and a regular exercise program as the means to achieve improved blood pressure control. [name] agreed with this plan and agreed to follow up as directed. We will continue to monitor her blood pressure as well as her progress with the above lifestyle modifications. [name] agrees to continue Chlorthalidone 25 mg once daily #30 with no refills and she will watch for signs of hypotension as she continues her lifestyle modifications.  Diabetes II Hannah Gibson has been given extensive diabetes education by myself today including ideal fasting and post-prandial blood glucose readings, individual ideal Hgb A1c goals and hypoglycemia prevention. We discussed the importance of good blood sugar control to decrease the likelihood of diabetic complications such as nephropathy, neuropathy, limb loss, blindness, coronary artery disease, and death. We discussed the importance of intensive lifestyle modification including diet, exercise and weight loss as the first line treatment for diabetes. Hannah Gibson will continue metformin twice daily and follow up at the agreed upon time.  Cardiovascular risk counseling [name] was given extended (15 minutes) coronary artery disease prevention counseling today. She is 58 y.o. female and has risk factors for heart disease including obesity, hypertension and diabetes We discussed intensive lifestyle modifications today with an emphasis on specific weight loss instructions and strategies. Pt was also informed of the importance of increasing exercise and decreasing saturated fats to help prevent heart disease.  Obesity Hannah Gibson is currently in the action stage of change. As  such, her goal is to continue with weight loss efforts She has agreed to follow the Category 2 plan Hannah Gibson has been instructed to work up to a goal of 150 minutes of combined  cardio and strengthening exercise per week for weight loss and overall health benefits. We discussed the following Behavioral Modification Strategies today: planning for success and increasing vegetables  I encouraged her to make an appointment to see podiatry about her heel pain.  Hannah Gibson has agreed to follow up with our clinic in 2 to 3 weeks. She was informed of the importance of frequent follow up visits to maximize her success with intensive lifestyle modifications for her multiple health conditions.  ALLERGIES: No Known Allergies  MEDICATIONS: Current Outpatient Medications on File Prior to Visit  Medication Sig Dispense Refill  . budesonide-formoterol (SYMBICORT) 160-4.5 MCG/ACT inhaler Inhale 2 puffs into the lungs 2 (two) times daily. 1 Inhaler 5  . DUEXIS 800-26.6 MG TABS Take 1 tablet by mouth 3 (three) times daily.  6  . metFORMIN (GLUCOPHAGE) 500 MG tablet Take 1 tablet (500 mg total) by mouth 2 (two) times daily. 60 tablet 0  . montelukast (SINGULAIR) 10 MG tablet TAKE 1 TABLET BY MOUTH EVERY NIGHT AT BEDTIME 30 tablet 2  . Multiple Vitamin (MULTIVITAMIN) tablet Take 1 tablet by mouth daily.    . TRAVATAN Z 0.004 % SOLN ophthalmic solution   3  . Vitamin D, Ergocalciferol, (DRISDOL) 1.25 MG (50000 UT) CAPS capsule Take 1 capsule (50,000 Units total) by mouth every 14 (fourteen) days. 6 capsule 0   No current facility-administered medications on file prior to visit.     PAST MEDICAL HISTORY: Past Medical History:  Diagnosis Date  . Asthma   . Back pain   . HSV infection   . Joint pain   . Lactose intolerance   . Sciatica     PAST SURGICAL HISTORY: Past Surgical History:  Procedure Laterality Date  . MYOMECTOMY  1998  . TOTAL ABDOMINAL HYSTERECTOMY W/ BILATERAL SALPINGOOPHORECTOMY  12/03/1997   secondary to fibroids    SOCIAL HISTORY: Social History   Tobacco Use  . Smoking status: Never Smoker  . Smokeless tobacco: Never Used  Substance Use Topics  .  Alcohol use: Yes    Alcohol/week: 5.0 standard drinks    Types: 5 Standard drinks or equivalent per week  . Drug use: No    FAMILY HISTORY: Family History  Problem Relation Age of Onset  . Diabetes Mother   . Hypertension Mother   . Kidney disease Mother   . Glaucoma Mother   . Obesity Mother   . Glaucoma Sister   . Diabetes Maternal Aunt   . Allergic rhinitis Neg Hx   . Asthma Neg Hx   . Eczema Neg Hx   . Urticaria Neg Hx     ROS: Review of Systems  Constitutional: Positive for weight loss.  Respiratory: Negative for shortness of breath (on exertion).   Cardiovascular: Negative for chest pain.  Endo/Heme/Allergies:       Negative for polyphagia    PHYSICAL EXAM: Blood pressure 112/69, pulse (!) 54, temperature 98.5 F (36.9 C), temperature source Oral, height 5\' 5"  (1.651 m), weight 256 lb (116.1 kg), last menstrual period 12/09/1997, SpO2 100 %. Body mass index is 42.6 kg/m. Physical Exam Vitals signs reviewed.  Constitutional:      Appearance: Normal appearance. She is well-developed. She is obese.  Cardiovascular:     Rate and Rhythm: Normal rate.  Pulmonary:  Effort: Pulmonary effort is normal.  Musculoskeletal: Normal range of motion.  Skin:    General: Skin is warm and dry.  Neurological:     Mental Status: She is alert and oriented to person, place, and time.  Psychiatric:        Mood and Affect: Mood normal.        Behavior: Behavior normal.     RECENT LABS AND TESTS: BMET    Component Value Date/Time   NA 140 05/17/2019 1101   K 4.8 05/17/2019 1101   CL 99 05/17/2019 1101   CO2 25 05/17/2019 1101   GLUCOSE 101 (H) 05/17/2019 1101   BUN 25 (H) 05/17/2019 1101   CREATININE 1.06 (H) 05/17/2019 1101   CALCIUM 9.7 05/17/2019 1101   GFRNONAA 59 (L) 05/17/2019 1101   GFRAA 68 05/17/2019 1101   Lab Results  Component Value Date   HGBA1C 6.4 (H) 05/17/2019   HGBA1C 6.6 (H) 10/11/2018   Lab Results  Component Value Date   INSULIN 11.5  10/11/2018   CBC    Component Value Date/Time   WBC 5.9 07/28/2018 1549   RBC 4.89 07/28/2018 1549   HGB 14.0 07/28/2018 1549   HGB 14.1 12/19/2012 1603   HCT 43.0 07/28/2018 1549   MCV 88 07/28/2018 1549   MCH 28.6 07/28/2018 1549   MCHC 32.6 07/28/2018 1549   RDW 12.2 (L) 07/28/2018 1549   LYMPHSABS 1.9 07/28/2018 1549   EOSABS 0.5 (H) 07/28/2018 1549   BASOSABS 0.0 07/28/2018 1549   Iron/TIBC/Ferritin/ %Sat No results found for: IRON, TIBC, FERRITIN, IRONPCTSAT Lipid Panel     Component Value Date/Time   CHOL 206 (H) 05/17/2019 1101   TRIG 73 05/17/2019 1101   HDL 47 05/17/2019 1101   CHOLHDL 3.7 10/11/2018 1301   LDLCALC 144 (H) 05/17/2019 1101   Hepatic Function Panel     Component Value Date/Time   PROT 7.1 05/17/2019 1101   ALBUMIN 4.6 05/17/2019 1101   AST 19 05/17/2019 1101   ALT 21 05/17/2019 1101   ALKPHOS 91 05/17/2019 1101   BILITOT 0.2 05/17/2019 1101      Component Value Date/Time   TSH 1.880 10/11/2018 1301     Ref. Range 05/17/2019 11:01  Vitamin D, 25-Hydroxy Latest Ref Range: 30.0 - 100.0 ng/mL 67.2    OBESITY BEHAVIORAL INTERVENTION VISIT  Today's visit was # 16   Starting weight: 281 lbs Starting date: 10/11/2018 Today's weight : 256 lbs Today's date: 07/19/2019 Total lbs lost to date: 25    07/19/2019  Height 5\' 5"  (1.651 m)  Weight 256 lb (116.1 kg)  BMI (Calculated) 42.6  BLOOD PRESSURE - SYSTOLIC [id number]  BLOOD PRESSURE - DIASTOLIC 69   Body Fat % 123XX123 %  Total Body Water (lbs) 81.6 lbs    ASK: We discussed the diagnosis of obesity with Hannah Gibson today and [place]ia agreed to give [name] permission to discuss obesity behavioral modification therapy today.  ASSESS: Dolphine has the diagnosis of obesity and her BMI today is 42.6 Min is in the action stage of change   ADVISE: Naomie was educated on the multiple health risks of obesity as well as the benefit of weight loss to improve her health. She was advised  of the need for long term treatment and the importance of lifestyle modifications to improve her current health and to decrease her risk of future health problems.  AGREE: Multiple dietary modification options and treatment options were discussed and  Truc agreed to  follow the recommendations documented in the above note.  ARRANGE: Faatimah was educated on the importance of frequent visits to treat obesity as outlined per CMS and USPSTF guidelines and agreed to schedule her next follow up appointment today.  Corey Skains, am acting as Location manager for [employer], FNP-C.  I have reviewed the above documentation for accuracy and completeness, and I agree with the above.  - Nevea Spiewak, FNP-C.

## 2019-07-25 ENCOUNTER — Encounter (INDEPENDENT_AMBULATORY_CARE_PROVIDER_SITE_OTHER): Payer: Self-pay | Admitting: Family Medicine

## 2019-07-31 MED FILL — PEG-3350 SOLUTION: 420 | 1 days supply | Qty: 4000 | Fill #0

## 2019-08-02 MED FILL — TRAVOPROST (BAK FREE) 0.004: 0.004 | 25 days supply | Qty: 3 | Fill #1

## 2019-08-06 DIAGNOSIS — Z1159 Encounter for screening for other viral diseases: Secondary | ICD-10-CM | POA: Diagnosis not present

## 2019-08-08 ENCOUNTER — Encounter (INDEPENDENT_AMBULATORY_CARE_PROVIDER_SITE_OTHER): Payer: Self-pay | Admitting: Family Medicine

## 2019-08-08 ENCOUNTER — Encounter: Payer: Self-pay | Admitting: Podiatry

## 2019-08-08 ENCOUNTER — Other Ambulatory Visit: Payer: Self-pay | Admitting: Podiatry

## 2019-08-08 ENCOUNTER — Other Ambulatory Visit: Payer: Self-pay

## 2019-08-08 ENCOUNTER — Ambulatory Visit (INDEPENDENT_AMBULATORY_CARE_PROVIDER_SITE_OTHER): Payer: 59 | Admitting: Family Medicine

## 2019-08-08 ENCOUNTER — Ambulatory Visit (INDEPENDENT_AMBULATORY_CARE_PROVIDER_SITE_OTHER): Payer: 59 | Admitting: Podiatry

## 2019-08-08 ENCOUNTER — Ambulatory Visit (INDEPENDENT_AMBULATORY_CARE_PROVIDER_SITE_OTHER): Payer: 59

## 2019-08-08 VITALS — BP 113/64 | HR 51 | Resp 16 | Ht 65.0 in | Wt 260.0 lb

## 2019-08-08 VITALS — BP 115/70 | HR 56 | Temp 98.0°F | Ht 65.0 in | Wt 260.0 lb

## 2019-08-08 DIAGNOSIS — E7849 Other hyperlipidemia: Secondary | ICD-10-CM

## 2019-08-08 DIAGNOSIS — E559 Vitamin D deficiency, unspecified: Secondary | ICD-10-CM

## 2019-08-08 DIAGNOSIS — M79672 Pain in left foot: Secondary | ICD-10-CM

## 2019-08-08 DIAGNOSIS — M722 Plantar fascial fibromatosis: Secondary | ICD-10-CM

## 2019-08-08 DIAGNOSIS — Z6841 Body Mass Index (BMI) 40.0 and over, adult: Secondary | ICD-10-CM | POA: Diagnosis not present

## 2019-08-08 DIAGNOSIS — Z9189 Other specified personal risk factors, not elsewhere classified: Secondary | ICD-10-CM

## 2019-08-08 DIAGNOSIS — H401131 Primary open-angle glaucoma, bilateral, mild stage: Secondary | ICD-10-CM | POA: Diagnosis not present

## 2019-08-08 MED ORDER — ATORVASTATIN CALCIUM 10 MG PO TABS: 10.0000 mg | ORAL_TABLET | Freq: Every day | ORAL | 0 refills | Status: DC

## 2019-08-08 MED FILL — ATORVASTATIN 10 MG TABLET: 10 | 30 days supply | Qty: 30 | Fill #0

## 2019-08-08 NOTE — Progress Notes (Signed)
Subjective:   Patient ID: Hannah Gibson, female   DOB: 58 y.o.   MRN: [id number]   HPI Patient presents stating she has been having a lot of pain in the bottom of the left heel and she does 12-hour shifts and states it is become increasingly hard for her to weight-bear.  Patient does not remember injury and has had a history of this and history of flatfeet and does not smoke and likes to be active   Review of Systems  All other systems reviewed and are negative.       Objective:  Physical Exam Vitals signs and nursing note reviewed.  Constitutional:      Appearance: She is well-developed.  Pulmonary:     Effort: Pulmonary effort is normal.  Musculoskeletal: Normal range of motion.  Skin:    General: Skin is warm.  Neurological:     Mental Status: She is alert.     Neurovascular status intact muscle strength found to be adequate range of motion within normal limits.  Patient is found to have exquisite discomfort plantar aspect left heel at the insertional point tendon calcaneus with inflammation fluid buildup and moderate depression of the arch noted.  Patient has good digital perfusion and is well oriented x3     Assessment:  Acute plantar fasciitis left with inflammation fluid of the medial band with obesity is complicating factor along with chronic flat feet     Plan:  H&P conditions reviewed and today I did sterile prep and injected the fascial band 3 mg Kenalog 500 Xylocaine applied fascial brace and due to long-term orthotic usage with her orthotics have been flattened out we went ahead and casted her for new orthotics.  Patient will be seen back to recheck when ready and may be seen back by me at the same time  X-rays indicate significant flatfoot deformity with large plantar spur noted with no indication of stress fracture

## 2019-08-08 NOTE — Progress Notes (Signed)
Office: [phone]  /  Fax: [phone]   HPI:   Chief Complaint: OBESITY Hannah Gibson is here to discuss her progress with her obesity treatment plan. She is on the Category 2 plan and is following her eating plan approximately 20 % of the time. She states she is exercising 0 minutes 0 times per week. Hannah Gibson is prepping for a colonoscopy today, so she will be off the plan today and tomorrow. She has been off the plan recently, due to her birthday, and a week of vacation. Her weight is 260 lb (117.9 kg) today and has had a weight gain of 4 pounds over a period of 3 weeks since her last visit. She has lost 21 lbs since starting treatment with [name].  Hyperlipidemia Hannah Gibson has hyperlipidemia and her last LDL was 144 and HDL was low at 47 and triglycerides were 73. Atorvastatin was started two months ago. She has been trying to improve her cholesterol levels with intensive lifestyle modification including a low saturated fat diet, exercise and weight loss.  The 10-year ASCVD risk score Hannah Gibson Hannah Gibson., Hannah al., 2013) is: 9.6%   Values used to calculate the score:     Age: 33 years     Sex: Female     Is Non-Hispanic African American: Yes     Diabetic: Yes     Tobacco smoker: No     Systolic Blood Pressure: 123456 mmHg     Is BP treated: Yes     HDL Cholesterol: 47 mg/dL     Total Cholesterol: 206 mg/dL  At risk for cardiovascular disease Hannah Gibson is at a higher than average risk for cardiovascular disease due to obesity and hyperlipidemia. She currently denies any chest pain.  Vitamin D deficiency Hannah Gibson has a diagnosis of vitamin D deficiency. Hannah Gibson is currently taking vit D every 14 days and she denies nausea, vomiting or muscle weakness. Her last vitamin D level was at goal (67.2 on 05/17/19).  ASSESSMENT AND PLAN:  Other hyperlipidemia - Plan: atorvastatin (LIPITOR) 10 MG tablet  Vitamin D deficiency  At risk for heart disease  Class 3 severe obesity with serious  comorbidity and body mass index (BMI) of 40.0 to 44.9 in adult, unspecified obesity type ([place])  PLAN:  Hyperlipidemia Hannah Gibson was informed of the American Heart Association Guidelines emphasizing intensive lifestyle modifications as the first line treatment for hyperlipidemia. We discussed many lifestyle modifications today in depth, and Hannah Gibson will continue to work on decreasing saturated fats such as fatty red meat, butter and many fried foods. She will also increase vegetables and lean protein in her diet and continue to work on exercise and weight loss efforts. Hannah Gibson to continue Atorvastatin 10 mg daily #30 with no refills and we will recheck fasting lipid panel next month.  Cardiovascular risk counseling Hannah Gibson extended (15 minutes) coronary artery disease prevention counseling today. She is 58 y.o. female and has risk factors for heart disease including obesity and hyperlipidemia. We discussed intensive lifestyle modifications today with an emphasis on specific weight loss instructions and strategies. Pt was also informed of the importance of increasing exercise and decreasing saturated fats to help prevent heart disease.  Vitamin D Deficiency Hannah Gibson was informed that low vitamin D levels contributes to fatigue and are associated with obesity, breast, and colon cancer. Hannah Gibson will continue to take prescription Vit D @50 ,000 IU every week (no prescription needed) and she will follow up for routine testing of vitamin D, at least 2-3 times per  year. She was informed of the risk of over-replacement of vitamin D and Gibson to not increase her dose unless she discusses this with [name] first.  Obesity Hannah Gibson is currently in the action stage of change. As such, her goal is to continue with weight loss efforts She has agreed to follow the Category 2 plan Hannah Gibson has been instructed to work up to a goal of 150 minutes of combined cardio and strengthening exercise per  week for weight loss and overall health benefits. We discussed the following Behavioral Modification Strategies today: planning for success, increasing lean protein intake, decreasing simple carbohydrates  and holiday eating strategies   Hannah Gibson has agreed to follow up with our clinic in 3 weeks. She was informed of the importance of frequent follow up visits to maximize her success with intensive lifestyle modifications for her multiple health conditions.  ALLERGIES: Allergies  Allergen Reactions  . Latex Itching and Rash    MEDICATIONS: Current Outpatient Medications on File Prior to Visit  Medication Sig Dispense Refill  . budesonide-formoterol (SYMBICORT) 160-4.5 MCG/ACT inhaler Inhale 2 puffs into the lungs 2 (two) times daily. 1 Inhaler 5  . chlorthalidone (HYGROTON) 25 MG tablet Take 1 tablet (25 mg total) by mouth daily. 30 tablet 0  . DUEXIS 800-26.6 MG TABS Take 1 tablet by mouth 3 (three) times daily.  6  . metFORMIN (GLUCOPHAGE) 500 MG tablet Take 1 tablet (500 mg total) by mouth 2 (two) times daily. 60 tablet 0  . montelukast (SINGULAIR) 10 MG tablet TAKE 1 TABLET BY MOUTH EVERY NIGHT AT BEDTIME 30 tablet 2  . Multiple Vitamin (MULTIVITAMIN) tablet Take 1 tablet by mouth daily.    . TRAVATAN Z 0.004 % SOLN ophthalmic solution   3  . Vitamin D, Ergocalciferol, (DRISDOL) 1.25 MG (50000 UT) CAPS capsule Take 1 capsule (50,000 Units total) by mouth every 14 (fourteen) days. 6 capsule 0   No current facility-administered medications on file prior to visit.     PAST MEDICAL HISTORY: Past Medical History:  Diagnosis Date  . Asthma   . Back pain   . HSV infection   . Joint pain   . Lactose intolerance   . Sciatica     PAST SURGICAL HISTORY: Past Surgical History:  Procedure Laterality Date  . MYOMECTOMY  1998  . TOTAL ABDOMINAL HYSTERECTOMY W/ BILATERAL SALPINGOOPHORECTOMY  12/03/1997   secondary to fibroids    SOCIAL HISTORY: Social History   Tobacco Use  .  Smoking status: Never Smoker  . Smokeless tobacco: Never Used  Substance Use Topics  . Alcohol use: Yes    Alcohol/week: 5.0 standard drinks    Types: 5 Standard drinks or equivalent per week  . Drug use: No    FAMILY HISTORY: Family History  Problem Relation Age of Onset  . Diabetes Mother   . Hypertension Mother   . Kidney disease Mother   . Glaucoma Mother   . Obesity Mother   . Glaucoma Sister   . Diabetes Maternal Aunt   . Allergic rhinitis Neg Hx   . Asthma Neg Hx   . Eczema Neg Hx   . Urticaria Neg Hx     ROS: Review of Systems  Constitutional: Negative for weight loss.  Cardiovascular: Negative for chest pain.  Gastrointestinal: Negative for nausea and vomiting.  Musculoskeletal:       Negative for muscle weakness    PHYSICAL EXAM: Blood pressure 115/70, pulse (!) 56, temperature 98 F (36.7 C), temperature source Other (Comment),  height 5\' 5"  (1.651 m), weight 260 lb (117.9 kg), last menstrual period 12/09/1997, SpO2 100 %. Body mass index is 43.27 kg/m. Physical Exam Vitals signs reviewed.  Constitutional:      Appearance: Normal appearance. She is well-developed. She is obese.  Cardiovascular:     Rate and Rhythm: Normal rate.  Pulmonary:     Effort: Pulmonary effort is normal.  Musculoskeletal: Normal range of motion.  Skin:    General: Skin is warm and dry.  Neurological:     Mental Status: She is alert and oriented to person, place, and time.  Psychiatric:        Mood and Affect: Mood normal.        Behavior: Behavior normal.     RECENT LABS AND TESTS: BMET    Component Value Date/Time   NA 140 05/17/2019 1101   K 4.8 05/17/2019 1101   CL 99 05/17/2019 1101   CO2 25 05/17/2019 1101   GLUCOSE 101 (H) 05/17/2019 1101   BUN 25 (H) 05/17/2019 1101   CREATININE 1.06 (H) 05/17/2019 1101   CALCIUM 9.7 05/17/2019 1101   GFRNONAA 59 (L) 05/17/2019 1101   GFRAA 68 05/17/2019 1101   Lab Results  Component Value Date   HGBA1C 6.4 (H)  05/17/2019   HGBA1C 6.6 (H) 10/11/2018   Lab Results  Component Value Date   INSULIN 11.5 10/11/2018   CBC    Component Value Date/Time   WBC 5.9 07/28/2018 1549   RBC 4.89 07/28/2018 1549   HGB 14.0 07/28/2018 1549   HGB 14.1 12/19/2012 1603   HCT 43.0 07/28/2018 1549   MCV 88 07/28/2018 1549   MCH 28.6 07/28/2018 1549   MCHC 32.6 07/28/2018 1549   RDW 12.2 (L) 07/28/2018 1549   LYMPHSABS 1.9 07/28/2018 1549   EOSABS 0.5 (H) 07/28/2018 1549   BASOSABS 0.0 07/28/2018 1549   Iron/TIBC/Ferritin/ %Sat No results found for: IRON, TIBC, FERRITIN, IRONPCTSAT Lipid Panel     Component Value Date/Time   CHOL 206 (H) 05/17/2019 1101   TRIG 73 05/17/2019 1101   HDL 47 05/17/2019 1101   CHOLHDL 3.7 10/11/2018 1301   LDLCALC 144 (H) 05/17/2019 1101   Hepatic Function Panel     Component Value Date/Time   PROT 7.1 05/17/2019 1101   ALBUMIN 4.6 05/17/2019 1101   AST 19 05/17/2019 1101   ALT 21 05/17/2019 1101   ALKPHOS 91 05/17/2019 1101   BILITOT 0.2 05/17/2019 1101      Component Value Date/Time   TSH 1.880 10/11/2018 1301    Results for KARILYN, CRUSER A (MRN [id number]) as of 08/08/2019 17:23  Ref. Range 05/17/2019 11:01  Vitamin D, 25-Hydroxy Latest Ref Range: 30.0 - 100.0 ng/mL 67.2    OBESITY BEHAVIORAL INTERVENTION VISIT  Today's visit was # 17   Starting weight: 281 lbs Starting date: 10/11/2018 Today's weight : 260 lbs Today's date: 08/08/2019 Total lbs lost to date: 21    08/08/2019 1200  Height 5\' 5"  (1.651 m)  Weight 260 lb (117.9 kg)  BMI (Calculated) 43.27   Body Fat % 51.3 %  Total Body Water (lbs) 84 lbs    08/08/2019  BLOOD PRESSURE - SYSTOLIC [id number]  BLOOD PRESSURE - DIASTOLIC 70   ASK: We discussed the diagnosis of obesity with [name] today and Rakya agreed to give [name] permission to discuss obesity behavioral modification therapy today.  ASSESS: Marlean has the diagnosis of obesity and her BMI today is 43.27 Trishna is  in  the action stage of change   ADVISE: Albertine was educated on the multiple health risks of obesity as well as the benefit of weight loss to improve her health. She was advised of the need for long term treatment and the importance of lifestyle modifications to improve her current health and to decrease her risk of future health problems.  AGREE: Multiple dietary modification options and treatment options were discussed and  Vincie agreed to follow the recommendations documented in the above note.  ARRANGE: Blakeleigh was educated on the importance of frequent visits to treat obesity as outlined per CMS and USPSTF guidelines and agreed to schedule her next follow up appointment today.  Corey Skains, am acting as Location manager for [employer], FNP-C.  I have reviewed the above documentation for accuracy and completeness, and I agree with the above.  -  , FNP-C.

## 2019-08-08 NOTE — Patient Instructions (Signed)

## 2019-08-08 NOTE — Progress Notes (Signed)
   Subjective:    Patient ID: Hannah Gibson, female    DOB: [birth year], 58 y.o.   MRN: [id number]  HPI    Review of Systems  All other systems reviewed and are negative.      Objective:   Physical Exam        Assessment & Plan:

## 2019-08-09 ENCOUNTER — Encounter (INDEPENDENT_AMBULATORY_CARE_PROVIDER_SITE_OTHER): Payer: Self-pay | Admitting: Family Medicine

## 2019-08-09 DIAGNOSIS — Z86010 Personal history of colonic polyps: Secondary | ICD-10-CM | POA: Diagnosis not present

## 2019-08-09 DIAGNOSIS — K5730 Diverticulosis of large intestine without perforation or abscess without bleeding: Secondary | ICD-10-CM | POA: Diagnosis not present

## 2019-08-09 DIAGNOSIS — K635 Polyp of colon: Secondary | ICD-10-CM | POA: Diagnosis not present

## 2019-08-09 DIAGNOSIS — D123 Benign neoplasm of transverse colon: Secondary | ICD-10-CM | POA: Diagnosis not present

## 2019-08-10 ENCOUNTER — Other Ambulatory Visit: Payer: Self-pay | Admitting: Family Medicine

## 2019-08-10 DIAGNOSIS — Z1231 Encounter for screening mammogram for malignant neoplasm of breast: Secondary | ICD-10-CM

## 2019-08-14 ENCOUNTER — Other Ambulatory Visit (INDEPENDENT_AMBULATORY_CARE_PROVIDER_SITE_OTHER): Payer: Self-pay | Admitting: Family Medicine

## 2019-08-14 DIAGNOSIS — E118 Type 2 diabetes mellitus with unspecified complications: Secondary | ICD-10-CM

## 2019-08-14 MED ORDER — METFORMIN HCL 500 MG PO TABS: 500.0000 mg | ORAL_TABLET | Freq: Two times a day (BID) | ORAL | 0 refills | Status: DC

## 2019-08-14 MED FILL — metFORMIN HCL 500 MG TABS: 500 | 30 days supply | Qty: 60 | Fill #0

## 2019-08-17 ENCOUNTER — Other Ambulatory Visit: Payer: Self-pay

## 2019-08-17 ENCOUNTER — Ambulatory Visit (INDEPENDENT_AMBULATORY_CARE_PROVIDER_SITE_OTHER): Payer: 59 | Admitting: Certified Nurse Midwife

## 2019-08-17 ENCOUNTER — Encounter: Payer: Self-pay | Admitting: Certified Nurse Midwife

## 2019-08-17 VITALS — BP 110/70 | HR 68 | Temp 97.1°F | Resp 16 | Ht 65.75 in | Wt 258.0 lb

## 2019-08-17 DIAGNOSIS — Z90710 Acquired absence of both cervix and uterus: Secondary | ICD-10-CM | POA: Diagnosis not present

## 2019-08-17 DIAGNOSIS — N6310 Unspecified lump in the right breast, unspecified quadrant: Secondary | ICD-10-CM

## 2019-08-17 DIAGNOSIS — Z01411 Encounter for gynecological examination (general) (routine) with abnormal findings: Secondary | ICD-10-CM

## 2019-08-17 DIAGNOSIS — N951 Menopausal and female climacteric states: Secondary | ICD-10-CM | POA: Diagnosis not present

## 2019-08-17 DIAGNOSIS — Z8679 Personal history of other diseases of the circulatory system: Secondary | ICD-10-CM | POA: Diagnosis not present

## 2019-08-17 MED FILL — ATORVASTATIN 10 MG TABLET: 10 | 30 days supply | Qty: 30 | Fill #0

## 2019-08-17 NOTE — Progress Notes (Signed)
58 y.o. [id number] Single  African American Fe here for annual exam.  Menopausal no vaginal bleeding or vaginal dryness. Not sexually active. Sees PCP for diabetes, vitamin D, cholesterol and allergies. No HSV outbreaks in past year, but needs Valtrex update. Mammogram due in 09/2019. No other health issues today.    Patient's last menstrual period was 12/09/1997 (exact date).          Sexually active: No  The current method of family planning is status post hysterectomy.    Exercising: Yes.    some Smoker:  no  Review of Systems  Constitutional: Negative.   HENT: Negative.   Eyes: Negative.   Respiratory: Negative.   Cardiovascular: Negative.   Gastrointestinal: Negative.   Genitourinary: Negative.   Musculoskeletal: Negative.   Skin: Negative.   Neurological: Negative.   Endo/Heme/Allergies: Negative.   Psychiatric/Behavioral: Negative.     Health Maintenance: Pap:  2001 neg History of Abnormal Pap: no MMG:  08-31-18 category b density birads 1:neg Self Breast exams: yes Colonoscopy:  2020 f/u 25yrs polyps BMD:   none TDaP:  UTD ? Shingles: not done Pneumonia: not done Hep C and HIV: not done Labs: no   reports that she has never smoked. She has never used smokeless tobacco. She reports current alcohol use of about 5.0 standard drinks of alcohol per week. She reports that she does not use drugs.  Past Medical History:  Diagnosis Date  . Asthma   . Back pain   . HSV infection   . Joint pain   . Lactose intolerance   . Sciatica     Past Surgical History:  Procedure Laterality Date  . MYOMECTOMY  1998  . TOTAL ABDOMINAL HYSTERECTOMY W/ BILATERAL SALPINGOOPHORECTOMY  12/03/1997   secondary to fibroids    Current Outpatient Medications  Medication Sig Dispense Refill  . atorvastatin (LIPITOR) 10 MG tablet Take 1 tablet (10 mg total) by mouth daily. 30 tablet 0  . chlorthalidone (HYGROTON) 25 MG tablet Take 1 tablet (25 mg total) by mouth daily. 30 tablet 0  . DUEXIS  800-26.6 MG TABS Take 1 tablet by mouth 3 (three) times daily.  6  . metFORMIN (GLUCOPHAGE) 500 MG tablet Take 1 tablet (500 mg total) by mouth 2 (two) times daily. (Patient taking differently: Take 500 mg by mouth daily. ) 60 tablet 0  . montelukast (SINGULAIR) 10 MG tablet TAKE 1 TABLET BY MOUTH EVERY NIGHT AT BEDTIME 30 tablet 2  . Multiple Vitamin (MULTIVITAMIN) tablet Take 1 tablet by mouth daily.    . TRAVATAN Z 0.004 % SOLN ophthalmic solution   3  . valACYclovir (VALTREX) 500 MG tablet valacyclovir 500 mg tablet    . Vitamin D, Ergocalciferol, (DRISDOL) 1.25 MG (50000 UT) CAPS capsule Take 1 capsule (50,000 Units total) by mouth every 14 (fourteen) days. 6 capsule 0  . budesonide-formoterol (SYMBICORT) 160-4.5 MCG/ACT inhaler Inhale 2 puffs into the lungs 2 (two) times daily. (Patient not taking: Reported on 08/17/2019) 1 Inhaler 5   No current facility-administered medications for this visit.     Family History  Problem Relation Age of Onset  . Diabetes Mother   . Hypertension Mother   . Kidney disease Mother   . Glaucoma Mother   . Obesity Mother   . Glaucoma Sister   . Diabetes Maternal Aunt   . Allergic rhinitis Neg Hx   . Asthma Neg Hx   . Eczema Neg Hx   . Urticaria Neg Hx  ROS:  Pertinent items are noted in HPI.  Otherwise, a comprehensive ROS was negative.  Exam:   BP 110/70   Pulse 68   Temp (!) 97.1 F (36.2 C) (Skin)   Resp 16   Ht 5' 5.75" (1.67 m)   Wt 258 lb (117 kg)   LMP 12/09/1997 (Exact Date)   BMI 41.96 kg/m  Height: 5' 5.75" (167 cm) Ht Readings from Last 3 Encounters:  08/17/19 5' 5.75" (1.67 m)  08/08/19 5\' 5"  (1.651 m)  08/08/19 5\' 5"  (1.651 m)    General appearance: alert, cooperative and appears stated age Head: Normocephalic, without obvious abnormality, atraumatic Neck: no adenopathy, supple, symmetrical, trachea midline and thyroid normal to inspection and palpation Lungs: clear to auscultation bilaterally Breasts: normal  appearance, no masses or tenderness, No nipple retraction or dimpling, No nipple discharge or bleeding, No axillary or supraclavicular adenopathy, pendulous bilateral, 3 cm mobile mass noted in right breast at 2-3 fb from outer edge at 8 o'clock. Non tender, patient also palpated. Heart: regular rate and rhythm Abdomen: soft, non-tender; no masses,  no organomegaly Extremities: extremities normal, atraumatic, no cyanosis or edema Skin: Skin color, texture, turgor normal. No rashes or lesions Lymph nodes: Cervical, supraclavicular, and axillary nodes normal. No abnormal inguinal nodes palpated Neurologic: Grossly normal   Pelvic: External genitalia:  no lesions              Urethra:  normal appearing urethra with no masses, tenderness or lesions              Bartholin's and Skene's: normal                 Vagina: normal appearing vagina with normal color and discharge, no lesions              Cervix: absent              Pap taken: No. Bimanual Exam:  Uterus:  uterus absent              Adnexa: no mass, fullness, tenderness and adnexa not palpable               Rectovaginal: Confirms               Anus:  normal sphincter tone, no lesions  Chaperone present: yes  A:  Well Woman with normal exam  Menopausal no HRT, S/P TAH with Ovaries retained, fibroid history  Right breast mass  Hypertension/cholesterol, Type 2 diabetes with PCP management  P:   Reviewed health and wellness pertinent to exam  Aware if vaginal dryness or bleeding needs to advise  Discussed right breast finding and need for evaluation with diagnostic mammogram and [name]. Patient will be called with appointment.  Continue follow up with PCP as indicated.  Pap smear: no   counseled on breast self exam, mammography screening, STD prevention, HIV risk factors and prevention, feminine hygiene, adequate intake of calcium and vitamin D, diet and exercise  return annually or prn  An After Visit Summary was printed and given to  the patient.

## 2019-08-17 NOTE — Patient Instructions (Signed)
EXERCISE AND DIET:  We recommended that you start or continue a regular exercise program for good health. Regular exercise means any activity that makes your heart beat faster and makes you sweat.  We recommend exercising at least 30 minutes per day at least 3 days a week, preferably 4 or 5.  We also recommend a diet low in fat and sugar.  Inactivity, poor dietary choices and obesity can cause diabetes, heart attack, stroke, and kidney damage, among others.    ALCOHOL AND SMOKING:  Women should limit their alcohol intake to no more than 7 drinks/beers/glasses of wine (combined, not each!) per week. Moderation of alcohol intake to this level decreases your risk of breast cancer and liver damage. And of course, no recreational drugs are part of a healthy lifestyle.  And absolutely no smoking or even second hand smoke. Most people know smoking can cause heart and lung diseases, but did you know it also contributes to weakening of your bones? Aging of your skin?  Yellowing of your teeth and nails?  CALCIUM AND VITAMIN D:  Adequate intake of calcium and Vitamin D are recommended.  The recommendations for exact amounts of these supplements seem to change often, but generally speaking 600 mg of calcium (either carbonate or citrate) and 800 units of Vitamin D per day seems prudent. Certain women may benefit from higher intake of Vitamin D.  If you are among these women, your doctor will have told you during your visit.    PAP SMEARS:  Pap smears, to check for cervical cancer or precancers,  have traditionally been done yearly, although recent scientific advances have shown that most women can have pap smears less often.  However, every woman still should have a physical exam from her gynecologist every year. It will include a breast check, inspection of the vulva and vagina to check for abnormal growths or skin changes, a visual exam of the cervix, and then an exam to evaluate the size and shape of the uterus and  ovaries.  And after 56 years of age, a rectal exam is indicated to check for rectal cancers. We will also provide age appropriate advice regarding health maintenance, like when you should have certain vaccines, screening for sexually transmitted diseases, bone density testing, colonoscopy, mammograms, etc.   MAMMOGRAMS:  All women over 40 years old should have a yearly mammogram. Many facilities now offer a "3D" mammogram, which may cost around $50 extra out of pocket. If possible,  we recommend you accept the option to have the 3D mammogram performed.  It both reduces the number of women who will be called back for extra views which then turn out to be normal, and it is better than the routine mammogram at detecting truly abnormal areas.    COLONOSCOPY:  Colonoscopy to screen for colon cancer is recommended for all women at age 50.  We know, you hate the idea of the prep.  We agree, BUT, having colon cancer and not knowing it is worse!!  Colon cancer so often starts as a polyp that can be seen and removed at colonscopy, which can quite literally save your life!  And if your first colonoscopy is normal and you have no family history of colon cancer, most women don't have to have it again for 10 years.  Once every ten years, you can do something that may end up saving your life, right?  We will be happy to help you get it scheduled when you are ready.    Be sure to check your insurance coverage so you understand how much it will cost.  It may be covered as a preventative service at no cost, but you should check your particular policy.      Menopause Menopause is the normal time of life when menstrual periods stop completely. It is usually confirmed by 12 months without a menstrual period. The transition to menopause (perimenopause) most often happens between the ages of 78 and 35. During perimenopause, hormone levels change in your body, which can cause symptoms and affect your health. Menopause may increase  your risk for:  Loss of bone (osteoporosis), which causes bone breaks (fractures).  Depression.  Hardening and narrowing of the arteries (atherosclerosis), which can cause heart attacks and strokes. What are the causes? This condition is usually caused by a natural change in hormone levels that happens as you get older. The condition may also be caused by surgery to remove both ovaries (bilateral oophorectomy). What increases the risk? This condition is more likely to start at an earlier age if you have certain medical conditions or treatments, including:  A tumor of the pituitary gland in the brain.  A disease that affects the ovaries and hormone production.  Radiation treatment for cancer.  Certain cancer treatments, such as chemotherapy or hormone (anti-estrogen) therapy.  Heavy smoking and excessive alcohol use.  Family history of early menopause. This condition is also more likely to develop earlier in women who are very thin. What are the signs or symptoms? Symptoms of this condition include:  Hot flashes.  Irregular menstrual periods.  Night sweats.  Changes in feelings about sex. This could be a decrease in sex drive or an increased comfort around your sexuality.  Vaginal dryness and thinning of the vaginal walls. This may cause painful intercourse.  Dryness of the skin and development of wrinkles.  Headaches.  Problems sleeping (insomnia).  Mood swings or irritability.  Memory problems.  Weight gain.  Hair growth on the face and chest.  Bladder infections or problems with urinating. How is this diagnosed? This condition is diagnosed based on your medical history, a physical exam, your age, your menstrual history, and your symptoms. Hormone tests may also be done. How is this treated? In some cases, no treatment is needed. You and your health care provider should make a decision together about whether treatment is necessary. Treatment will be based on  your individual condition and preferences. Treatment for this condition focuses on managing symptoms. Treatment may include:  Menopausal hormone therapy (MHT).  Medicines to treat specific symptoms or complications.  Acupuncture.  Vitamin or herbal supplements. Before starting treatment, make sure to let your health care provider know if you have a personal or family history of:  Heart disease.  Breast cancer.  Blood clots.  Diabetes.  Osteoporosis. Follow these instructions at home: Lifestyle  Do not use any products that contain nicotine or tobacco, such as cigarettes and e-cigarettes. If you need help quitting, ask your health care provider.  Get at least 30 minutes of physical activity on 5 or more days each week.  Avoid alcoholic and caffeinated beverages, as well as spicy foods. This may help prevent hot flashes.  Get 7-8 hours of sleep each night.  If you have hot flashes, try: ? Dressing in layers. ? Avoiding things that may trigger hot flashes, such as spicy food, warm places, or stress. ? Taking slow, deep breaths when a hot flash starts. ? Keeping a fan in your home and  office.  Find ways to manage stress, such as deep breathing, meditation, or journaling.  Consider going to group therapy with other women who are having menopause symptoms. Ask your health care provider about recommended group therapy meetings. Eating and drinking  Eat a healthy, balanced diet that contains whole grains, lean protein, low-fat dairy, and plenty of fruits and vegetables.  Your health care provider may recommend adding more soy to your diet. Foods that contain soy include tofu, tempeh, and soy milk.  Eat plenty of foods that contain calcium and vitamin D for bone health. Items that are rich in calcium include low-fat milk, yogurt, beans, almonds, sardines, broccoli, and kale. Medicines  Take over-the-counter and prescription medicines only as told by your health care provider.   Talk with your health care provider before starting any herbal supplements. If prescribed, take vitamins and supplements as told by your health care provider. These may include: ? Calcium. Women age 61 and older should get 1,200 mg (milligrams) of calcium every day. ? Vitamin D. Women need 600-800 International Units of vitamin D each day. ? Vitamins B12 and B6. Aim for 50 micrograms of B12 and 1.5 mg of B6 each day. General instructions  Keep track of your menstrual periods, including: ? When they occur. ? How heavy they are and how long they last. ? How much time passes between periods.  Keep track of your symptoms, noting when they start, how often you have them, and how long they last.  Use vaginal lubricants or moisturizers to help with vaginal dryness and improve comfort during sex.  Keep all follow-up visits as told by your health care provider. This is important. This includes any group therapy or counseling. Contact a health care provider if:  You are still having menstrual periods after age 45.  You have pain during sex.  You have not had a period for 12 months and you develop vaginal bleeding. Get help right away if:  You have: ? Severe depression. ? Excessive vaginal bleeding. ? Pain when you urinate. ? A fast or irregular heart beat (palpitations). ? Severe headaches. ? Abdomen (abdominal) pain or severe indigestion.  You fell and you think you have a broken bone.  You develop leg or chest pain.  You develop vision problems.  You feel a lump in your breast. Summary  Menopause is the normal time of life when menstrual periods stop completely. It is usually confirmed by 12 months without a menstrual period.  The transition to menopause (perimenopause) most often happens between the ages of 43 and 68.  Symptoms can be managed through medicines, lifestyle changes, and complementary therapies such as acupuncture.  Eat a balanced diet that is rich in  nutrients to promote bone health and heart health and to manage symptoms during menopause. This information is not intended to replace advice given to you by your health care provider. Make sure you discuss any questions you have with your health care provider. Document Released: 12/04/2003 Document Revised: 08/26/2017 Document Reviewed: 10/16/2016 Elsevier Patient Education  2020 [employer].

## 2019-08-20 ENCOUNTER — Telehealth: Payer: Self-pay | Admitting: *Deleted

## 2019-08-20 DIAGNOSIS — N6310 Unspecified lump in the right breast, unspecified quadrant: Secondary | ICD-10-CM

## 2019-08-20 NOTE — Telephone Encounter (Signed)
Last Screening MMG 08/31/18.  Order placed for bilateral Dx MMG and right breast US, if needed. Spoke with Benjamine Mola at [facility]. Patient scheduled for 09/03/19 at 1:20pm, arrive at 1pm. Screening has been cancelled for 09/2019.   Patient notified of appt. Patient is agreeable to date and time.   Placed in [facility] hold.   Routing to provider for final review. Patient is agreeable to disposition. Will close encounter.

## 2019-08-20 NOTE — Telephone Encounter (Signed)
-----   Message from Regina Eck, CNM sent at 08/17/2019  1:59 PM EST ----- Please schedule patient for diagnostic mammogram and [name] of right breast due to breast mass. Patient aware she will be called with appointment. [facility]

## 2019-08-29 ENCOUNTER — Ambulatory Visit (INDEPENDENT_AMBULATORY_CARE_PROVIDER_SITE_OTHER): Payer: 59 | Admitting: Family Medicine

## 2019-08-29 ENCOUNTER — Encounter (INDEPENDENT_AMBULATORY_CARE_PROVIDER_SITE_OTHER): Payer: Self-pay | Admitting: Family Medicine

## 2019-08-29 ENCOUNTER — Other Ambulatory Visit: Payer: Self-pay

## 2019-08-29 VITALS — BP 109/69 | HR 53 | Temp 97.8°F | Ht 65.0 in | Wt 257.0 lb

## 2019-08-29 DIAGNOSIS — I10 Essential (primary) hypertension: Secondary | ICD-10-CM | POA: Diagnosis not present

## 2019-08-29 DIAGNOSIS — Z9189 Other specified personal risk factors, not elsewhere classified: Secondary | ICD-10-CM

## 2019-08-29 DIAGNOSIS — E119 Type 2 diabetes mellitus without complications: Secondary | ICD-10-CM | POA: Diagnosis not present

## 2019-08-29 DIAGNOSIS — Z6841 Body Mass Index (BMI) 40.0 and over, adult: Secondary | ICD-10-CM

## 2019-08-30 NOTE — Progress Notes (Signed)
Office: [phone]  /  Fax: [phone]   HPI:   Chief Complaint: OBESITY Hannah Gibson is here to discuss her progress with her obesity treatment plan. She is on the Category 2 plan and is following her eating plan approximately 80 % of the time. She states she is exercising 0 minutes 0 times per week. Hannah Gibson did well over Thanksgiving. She worked that day so eating extra food was not an issue. Hannah Gibson lives alone but she does cook and meal prep. She has plantar fasciitis, which prevents exercise. She will get inserts for this. Her weight is 257 lb (116.6 kg) today and has had a weight loss of 3 pounds over a period of 3 weeks since her last visit. She has lost 24 lbs since starting treatment with [name].  Hypertension Hannah Gibson is a 58 y.o. female with hypertension. Her blood pressure is very good today. Carriann has been off her medication for three days, and she asked about trying to go without medication. It was added in January 2020 after two high blood pressures. [name] denies chest pain or shortness of breath on exertion. She is working weight loss to help control her blood pressure with the goal of decreasing her risk of heart attack and stroke. [name] blood pressure is currently controlled. BP Readings from Last 3 Encounters:  08/29/19 109/69  08/17/19 110/70  08/08/19 113/64     Diabetes II Hannah Gibson has a diagnosis of diabetes type II. She is well controlled on metformin. Her last A1c was at 6.4 (05/17/19). Hannah Gibson does not check CBGs at home. Hannah Gibson denies polyphagia or hypoglycemia. She has been working on intensive lifestyle modifications including diet, exercise, and weight loss to help control her blood glucose levels.  At risk for cardiovascular disease Hannah Gibson is at a higher than average risk for cardiovascular disease due to obesity, diabetes and hypertension. She currently denies any chest pain.  ASSESSMENT AND PLAN:  Essential  hypertension  Type 2 diabetes mellitus without complication, without long-term current use of insulin (HCC)  At risk for heart disease  Class 3 severe obesity with serious comorbidity and body mass index (BMI) of 40.0 to 44.9 in adult, unspecified obesity type ([place])  PLAN:  Hypertension We discussed sodium restriction, working on healthy weight loss, and a regular exercise program as the means to achieve improved blood pressure control. Hannah Gibson agreed with this plan and agreed to follow up as directed. We will continue to monitor her blood pressure as well as her progress with the above lifestyle modifications. We agreed to discontinue chlorthalidone and continue to monitor blood pressure. Hannah Gibson will watch for signs of hypotension as she continues her lifestyle modifications.  Diabetes II [name] has been given diabetes education by myself including ideal fasting and post-prandial blood glucose readings, individual ideal Hgb A1c goals and hypoglycemia prevention. We discussed the importance of good blood sugar control to decrease the likelihood of diabetic complications such as nephropathy, neuropathy, limb loss, blindness, coronary artery disease, and death. We discussed the importance of intensive lifestyle modification including diet, exercise and weight loss as the first line treatment for diabetes. Hannah Gibson will continue metformin and she will follow up at the agreed upon time.  Cardiovascular risk counseling Hannah Gibson was given extended (15 minutes) coronary artery disease prevention counseling today. She is 58 y.o. female and has risk factors for heart disease including obesity, hypertension and diabetes. We discussed intensive lifestyle modifications today with an emphasis on specific weight loss instructions and strategies. Pt  was also informed of the importance of increasing exercise and decreasing saturated fats to help prevent heart disease.  Obesity Hannah Gibson is currently in  the action stage of change. As such, her goal is to continue with weight loss efforts She has agreed to follow the Category 2 plan Hannah Gibson will add resistance 2 times per week for weight loss and overall health benefits. We discussed the following Behavioral Modification Strategies today: planning for success, increasing lean protein intake and decreasing simple carbohydrates   Hannah Gibson has agreed to follow up with our clinic in 2 to 3 weeks. She was informed of the importance of frequent follow up visits to maximize her success with intensive lifestyle modifications for her multiple health conditions.  ALLERGIES: Allergies  Allergen Reactions  . Latex Itching and Rash    MEDICATIONS: Current Outpatient Medications on File Prior to Visit  Medication Sig Dispense Refill  . atorvastatin (LIPITOR) 10 MG tablet Take 1 tablet (10 mg total) by mouth daily. 30 tablet 0  . budesonide-formoterol (SYMBICORT) 160-4.5 MCG/ACT inhaler Inhale 2 puffs into the lungs 2 (two) times daily. 1 Inhaler 5  . DUEXIS 800-26.6 MG TABS Take 1 tablet by mouth 3 (three) times daily.  6  . metFORMIN (GLUCOPHAGE) 500 MG tablet Take 1 tablet (500 mg total) by mouth 2 (two) times daily. (Patient taking differently: Take 500 mg by mouth daily. ) 60 tablet 0  . montelukast (SINGULAIR) 10 MG tablet TAKE 1 TABLET BY MOUTH EVERY NIGHT AT BEDTIME 30 tablet 2  . Multiple Vitamin (MULTIVITAMIN) tablet Take 1 tablet by mouth daily.    . TRAVATAN Z 0.004 % SOLN ophthalmic solution   3  . valACYclovir (VALTREX) 500 MG tablet valacyclovir 500 mg tablet    . Vitamin D, Ergocalciferol, (DRISDOL) 1.25 MG (50000 UT) CAPS capsule Take 1 capsule (50,000 Units total) by mouth every 14 (fourteen) days. 6 capsule 0   No current facility-administered medications on file prior to visit.     PAST MEDICAL HISTORY: Past Medical History:  Diagnosis Date  . Asthma   . Back pain   . HSV infection   . Joint pain   . Lactose intolerance    . Sciatica     PAST SURGICAL HISTORY: Past Surgical History:  Procedure Laterality Date  . MYOMECTOMY  1998  . TOTAL ABDOMINAL HYSTERECTOMY W/ BILATERAL SALPINGOOPHORECTOMY  12/03/1997   secondary to fibroids    SOCIAL HISTORY: Social History   Tobacco Use  . Smoking status: Never Smoker  . Smokeless tobacco: Never Used  Substance Use Topics  . Alcohol use: Yes    Alcohol/week: 5.0 standard drinks    Types: 5 Standard drinks or equivalent per week  . Drug use: No    FAMILY HISTORY: Family History  Problem Relation Age of Onset  . Diabetes Mother   . Hypertension Mother   . Kidney disease Mother   . Glaucoma Mother   . Obesity Mother   . Glaucoma Sister   . Diabetes Maternal Aunt   . Allergic rhinitis Neg Hx   . Asthma Neg Hx   . Eczema Neg Hx   . Urticaria Neg Hx     ROS: Review of Systems  Constitutional: Positive for weight loss.  Respiratory: Negative for shortness of breath (on exertion).   Cardiovascular: Negative for chest pain.  Endo/Heme/Allergies:       Negative for polyphagia Negative for hypoglycemia    PHYSICAL EXAM: Blood pressure 109/69, pulse (!) 53, temperature 97.8 F (36.6  C), temperature source Oral, height 5\' 5"  (075-GRM m), weight 257 lb (116.6 kg), last menstrual period 12/09/1997, SpO2 100 %. Body mass index is 42.77 kg/m. Physical Exam Vitals signs reviewed.  Constitutional:      Appearance: Normal appearance. She is well-developed. She is obese.  Cardiovascular:     Rate and Rhythm: Normal rate.  Pulmonary:     Effort: Pulmonary effort is normal.  Musculoskeletal: Normal range of motion.  Skin:    General: Skin is warm and dry.  Neurological:     Mental Status: She is alert and oriented to person, place, and time.  Psychiatric:        Mood and Affect: Mood normal.        Behavior: Behavior normal.     RECENT LABS AND TESTS: BMET    Component Value Date/Time   NA 140 05/17/2019 1101   K 4.8 05/17/2019 1101   CL 99  05/17/2019 1101   CO2 25 05/17/2019 1101   GLUCOSE 101 (H) 05/17/2019 1101   BUN 25 (H) 05/17/2019 1101   CREATININE 1.06 (H) 05/17/2019 1101   CALCIUM 9.7 05/17/2019 1101   GFRNONAA 59 (L) 05/17/2019 1101   GFRAA 68 05/17/2019 1101   Lab Results  Component Value Date   HGBA1C 6.4 (H) 05/17/2019   HGBA1C 6.6 (H) 10/11/2018   Lab Results  Component Value Date   INSULIN 11.5 10/11/2018   CBC    Component Value Date/Time   WBC 5.9 07/28/2018 1549   RBC 4.89 07/28/2018 1549   HGB 14.0 07/28/2018 1549   HGB 14.1 12/19/2012 1603   HCT 43.0 07/28/2018 1549   MCV 88 07/28/2018 1549   MCH 28.6 07/28/2018 1549   MCHC 32.6 07/28/2018 1549   RDW 12.2 (L) 07/28/2018 1549   LYMPHSABS 1.9 07/28/2018 1549   EOSABS 0.5 (H) 07/28/2018 1549   BASOSABS 0.0 07/28/2018 1549   Iron/TIBC/Ferritin/ %Sat No results found for: IRON, TIBC, FERRITIN, IRONPCTSAT Lipid Panel     Component Value Date/Time   CHOL 206 (H) 05/17/2019 1101   TRIG 73 05/17/2019 1101   HDL 47 05/17/2019 1101   CHOLHDL 3.7 10/11/2018 1301   LDLCALC 144 (H) 05/17/2019 1101   Hepatic Function Panel     Component Value Date/Time   PROT 7.1 05/17/2019 1101   ALBUMIN 4.6 05/17/2019 1101   AST 19 05/17/2019 1101   ALT 21 05/17/2019 1101   ALKPHOS 91 05/17/2019 1101   BILITOT 0.2 05/17/2019 1101      Component Value Date/Time   TSH 1.880 10/11/2018 1301     Ref. Range 05/17/2019 11:01  Vitamin D, 25-Hydroxy Latest Ref Range: 30.0 - 100.0 ng/mL 67.2    OBESITY BEHAVIORAL INTERVENTION VISIT  Today's visit was # 18   Starting weight: 281 lbs Starting date: 10/11/2018 Today's weight : 257 lbs Today's date: 08/29/2019 Total lbs lost to date: 24    08/29/2019  Height 5\' 5"  (1.651 m)  Weight 257 lb (116.6 kg)  BMI (Calculated) 42.77  BLOOD PRESSURE - SYSTOLIC 0000000  BLOOD PRESSURE - DIASTOLIC 69   Body Fat % [id number] %  Total Body Water (lbs) 83.4 lbs   ASK: We discussed the diagnosis of obesity with [name] today and Fiona agreed to give [name] permission to discuss obesity behavioral modification therapy today.  ASSESS: Cindi has the diagnosis of obesity and her BMI today is 42.77 Sheril is in the action stage of change   ADVISE: Levita was educated on  the multiple health risks of obesity as well as the benefit of weight loss to improve her health. She was advised of the need for long term treatment and the importance of lifestyle modifications to improve her current health and to decrease her risk of future health problems.  AGREE: Multiple dietary modification options and treatment options were discussed and  Sibylla agreed to follow the recommendations documented in the above note.  ARRANGE: Laasya was educated on the importance of frequent visits to treat obesity as outlined per CMS and USPSTF guidelines and agreed to schedule her next follow up appointment today.  I, Doreene Nest, am acting as transcriptionist for [employer], FNP-C  I have reviewed the above documentation for accuracy and completeness, and I agree with the above.  -  , FNP-C.

## 2019-09-03 ENCOUNTER — Encounter (INDEPENDENT_AMBULATORY_CARE_PROVIDER_SITE_OTHER): Payer: Self-pay | Admitting: Family Medicine

## 2019-09-03 ENCOUNTER — Other Ambulatory Visit: Payer: Self-pay

## 2019-09-06 ENCOUNTER — Ambulatory Visit
Admission: RE | Admit: 2019-09-06 | Discharge: 2019-09-06 | Disposition: A | Discharge status: Home / Self Care | Payer: 59 | Source: Ambulatory Visit | Attending: Certified Nurse Midwife | Admitting: Certified Nurse Midwife

## 2019-09-06 ENCOUNTER — Other Ambulatory Visit: Payer: Self-pay

## 2019-09-06 ENCOUNTER — Ambulatory Visit (INDEPENDENT_AMBULATORY_CARE_PROVIDER_SITE_OTHER): Payer: 59 | Admitting: Orthotics

## 2019-09-06 DIAGNOSIS — M722 Plantar fascial fibromatosis: Secondary | ICD-10-CM

## 2019-09-06 DIAGNOSIS — N6310 Unspecified lump in the right breast, unspecified quadrant: Secondary | ICD-10-CM

## 2019-09-06 DIAGNOSIS — R928 Other abnormal and inconclusive findings on diagnostic imaging of breast: Secondary | ICD-10-CM | POA: Diagnosis not present

## 2019-09-06 DIAGNOSIS — N6489 Other specified disorders of breast: Secondary | ICD-10-CM | POA: Diagnosis not present

## 2019-09-06 NOTE — Progress Notes (Signed)
Patient came in today to p/up functional foot orthotics.   The orthotics were assessed to both fit and function.  The F/O addressed the biomechanical issues/pathologies as intended, offering good longitudinal arch support, proper offloading, and foot support. There weren't any signs of discomfort or irritation.  The F/O fit properly in footwear with minimal trimming/adjustments. 

## 2019-09-11 ENCOUNTER — Ambulatory Visit (INDEPENDENT_AMBULATORY_CARE_PROVIDER_SITE_OTHER): Payer: 59 | Admitting: Bariatrics

## 2019-09-11 ENCOUNTER — Encounter (INDEPENDENT_AMBULATORY_CARE_PROVIDER_SITE_OTHER): Payer: Self-pay | Admitting: Bariatrics

## 2019-09-11 ENCOUNTER — Other Ambulatory Visit: Payer: Self-pay

## 2019-09-11 VITALS — BP 137/83 | HR 55 | Temp 98.1°F | Ht 65.0 in | Wt 259.0 lb

## 2019-09-11 DIAGNOSIS — Z6841 Body Mass Index (BMI) 40.0 and over, adult: Secondary | ICD-10-CM | POA: Diagnosis not present

## 2019-09-11 DIAGNOSIS — E7849 Other hyperlipidemia: Secondary | ICD-10-CM | POA: Diagnosis not present

## 2019-09-11 DIAGNOSIS — Z9189 Other specified personal risk factors, not elsewhere classified: Secondary | ICD-10-CM

## 2019-09-11 DIAGNOSIS — I10 Essential (primary) hypertension: Secondary | ICD-10-CM

## 2019-09-12 NOTE — Progress Notes (Signed)
Office: [phone]  /  Fax: [phone]   HPI:  Chief Complaint: OBESITY Hannah Gibson is here to discuss her progress with her obesity treatment plan. She is on the Category 2 plan and states she is following her eating plan approximately 80% of the time. She states she is exercising 0 minutes 0 times per week.  Hannah Gibson is up 2 lbs and doing well overall. She is getting adequate protein. She thinks that she may be eating more carbohydrates for lunch.  Today's visit was #19 Starting weight: 281 lbs Starting date: 10/11/2018 Today's weight: 259 lbs  Today's date: 09/11/2019 Total lbs lost to date: 22  Total lbs lost since last in-office visit: 0  Hypertension Hannah Gibson's blood pressure had been well controlled and she was taken off of her blood pressure medication, but her blood pressure now is going up (137/83 today).  At risk for cardiovascular disease Hannah Gibson is at a higher than average risk for cardiovascular disease due to obesity. She currently denies any chest pain.  Other Hyperlipidemia Hannah Gibson has a diagnosis of hyperlipidemia and is taking Lipitor. No myalgias.  ASSESSMENT AND PLAN:  Essential hypertension - Plan: chlorthalidone (HYGROTON) 25 MG tablet  Other hyperlipidemia  At risk for heart disease  Class 3 severe obesity with serious comorbidity and body mass index (BMI) of 40.0 to 44.9 in adult, unspecified obesity type ([place])  PLAN:  Hypertension Hannah Gibson is working on healthy weight loss and exercise to improve blood pressure control. She was given a prescription for chlorthalidone 25 mg 1 PO daily #30 with 0 refills and agrees to follow-up with our clinic in 3 weeks. She will check her blood pressure at home. We will watch for signs of hypotension as she continues her lifestyle modifications.  Cardiovascular risk counseling Hannah Gibson was given (~15 minutes) coronary artery disease prevention counseling today. She is 58 y.o. female and has risk  factors for heart disease including obesity. We discussed intensive lifestyle modifications today with an emphasis on specific weight loss instructions and strategies.   Other Hyperlipidemia Intensive lifestyle modifications as the first line treatment for hyperlipidemia. We discussed many lifestyle modifications today and Hannah Gibson will continue Lipitor, continue to work on diet, exercise and weight loss efforts.  Obesity Hannah Gibson is currently in the action stage of change. As such, her goal is to continue with weight loss efforts. She has agreed to follow the Category 2 plan. Hannah Gibson will work on meal planning, intentional eating, will make better choices at lunch, and increase her vegetables. Hannah Gibson is not able to walk (plantar fasciitis) and is wearing an orthotic. We discussed the following Behavioral Modification Strategies today: increasing lean protein intake, decreasing simple carbohydrates, increasing vegetables, increase H20 intake, decrease eating out, no skipping meals, work on meal planning and easy cooking plans, keeping healthy foods in the home, and planning for success.  Hannah Gibson has agreed to follow-up with our clinic in 3 weeks. She was informed of the importance of frequent follow-up visits to maximize her success with intensive lifestyle modifications for her multiple health conditions.  ALLERGIES: Allergies  Allergen Reactions  . Latex Itching and Rash    MEDICATIONS: Current Outpatient Medications on File Prior to Visit  Medication Sig Dispense Refill  . atorvastatin (LIPITOR) 10 MG tablet Take 1 tablet (10 mg total) by mouth daily. 30 tablet 0  . budesonide-formoterol (SYMBICORT) 160-4.5 MCG/ACT inhaler Inhale 2 puffs into the lungs 2 (two) times daily. 1 Inhaler 5  . DUEXIS 800-26.6 MG TABS Take 1 tablet by  mouth 3 (three) times daily.  6  . metFORMIN (GLUCOPHAGE) 500 MG tablet Take 1 tablet (500 mg total) by mouth 2 (two) times daily. (Patient taking  differently: Take 500 mg by mouth daily. ) 60 tablet 0  . montelukast (SINGULAIR) 10 MG tablet TAKE 1 TABLET BY MOUTH EVERY NIGHT AT BEDTIME 30 tablet 2  . Multiple Vitamin (MULTIVITAMIN) tablet Take 1 tablet by mouth daily.    . TRAVATAN Z 0.004 % SOLN ophthalmic solution   3  . valACYclovir (VALTREX) 500 MG tablet valacyclovir 500 mg tablet    . Vitamin D, Ergocalciferol, (DRISDOL) 1.25 MG (50000 UT) CAPS capsule Take 1 capsule (50,000 Units total) by mouth every 14 (fourteen) days. 6 capsule 0   No current facility-administered medications on file prior to visit.    PAST MEDICAL HISTORY: Past Medical History:  Diagnosis Date  . Asthma   . Back pain   . HSV infection   . Joint pain   . Lactose intolerance   . Sciatica     PAST SURGICAL HISTORY: Past Surgical History:  Procedure Laterality Date  . MYOMECTOMY  1998  . TOTAL ABDOMINAL HYSTERECTOMY W/ BILATERAL SALPINGOOPHORECTOMY  12/03/1997   secondary to fibroids    SOCIAL HISTORY: Social History   Tobacco Use  . Smoking status: Never Smoker  . Smokeless tobacco: Never Used  Substance Use Topics  . Alcohol use: Yes    Alcohol/week: 5.0 standard drinks    Types: 5 Standard drinks or equivalent per week  . Drug use: No    FAMILY HISTORY: Family History  Problem Relation Age of Onset  . Diabetes Mother   . Hypertension Mother   . Kidney disease Mother   . Glaucoma Mother   . Obesity Mother   . Glaucoma Sister   . Diabetes Maternal Aunt   . Allergic rhinitis Neg Hx   . Asthma Neg Hx   . Eczema Neg Hx   . Urticaria Neg Hx    ROS: Review of Systems  Constitutional: Negative for weight loss.  Musculoskeletal: Negative for myalgias.   PHYSICAL EXAM: Blood pressure 137/83, pulse (!) 55, temperature 98.1 F (36.7 C), height 5\' 5"  (1.651 m), last menstrual period 12/09/1997, SpO2 99 %. Body mass index is 42.77 kg/m. Physical Exam Vitals reviewed.  Constitutional:      Appearance: Normal appearance. She is  obese.  Cardiovascular:     Rate and Rhythm: Normal rate.     Pulses: Normal pulses.  Pulmonary:     Effort: Pulmonary effort is normal.     Breath sounds: Normal breath sounds.  Musculoskeletal:        General: Normal range of motion.  Skin:    General: Skin is warm and dry.  Neurological:     Mental Status: She is alert and oriented to person, place, and time.  Psychiatric:        Behavior: Behavior normal.   RECENT LABS AND TESTS: BMET    Component Value Date/Time   NA 140 05/17/2019 1101   K 4.8 05/17/2019 1101   CL 99 05/17/2019 1101   CO2 25 05/17/2019 1101   GLUCOSE 101 (H) 05/17/2019 1101   BUN 25 (H) 05/17/2019 1101   CREATININE 1.06 (H) 05/17/2019 1101   CALCIUM 9.7 05/17/2019 1101   GFRNONAA 59 (L) 05/17/2019 1101   GFRAA 68 05/17/2019 1101   Lab Results  Component Value Date   HGBA1C 6.4 (H) 05/17/2019   HGBA1C 6.6 (H) 10/11/2018   Lab  Results  Component Value Date   INSULIN 11.5 10/11/2018   CBC    Component Value Date/Time   WBC 5.9 07/28/2018 1549   RBC 4.89 07/28/2018 1549   HGB 14.0 07/28/2018 1549   HGB 14.1 12/19/2012 1603   HCT 43.0 07/28/2018 1549   MCV 88 07/28/2018 1549   MCH 28.6 07/28/2018 1549   MCHC 32.6 07/28/2018 1549   RDW 12.2 (L) 07/28/2018 1549   LYMPHSABS 1.9 07/28/2018 1549   EOSABS 0.5 (H) 07/28/2018 1549   BASOSABS 0.0 07/28/2018 1549   Iron/TIBC/Ferritin/ %Sat No results found for: IRON, TIBC, FERRITIN, IRONPCTSAT Lipid Panel     Component Value Date/Time   CHOL 206 (H) 05/17/2019 1101   TRIG 73 05/17/2019 1101   HDL 47 05/17/2019 1101   CHOLHDL 3.7 10/11/2018 1301   LDLCALC 144 (H) 05/17/2019 1101   Hepatic Function Panel     Component Value Date/Time   PROT 7.1 05/17/2019 1101   ALBUMIN 4.6 05/17/2019 1101   AST 19 05/17/2019 1101   ALT 21 05/17/2019 1101   ALKPHOS 91 05/17/2019 1101   BILITOT 0.2 05/17/2019 1101      Component Value Date/Time   TSH 1.880 10/11/2018 1301    OBESITY BEHAVIORAL  INTERVENTION VISIT DOCUMENTATION FOR INSURANCE (~15 minutes)  I, Michaelene Song, am acting as Location manager for [employer], DO  I have reviewed the above documentation for accuracy and completeness, and I agree with the above. Jearld Lesch, DO

## 2019-09-13 ENCOUNTER — Encounter (INDEPENDENT_AMBULATORY_CARE_PROVIDER_SITE_OTHER): Payer: Self-pay | Admitting: Bariatrics

## 2019-09-13 MED ORDER — CHLORTHALIDONE 25 MG PO TABS: 25.0000 mg | ORAL_TABLET | Freq: Every day | ORAL | 0 refills | Status: DC

## 2019-09-13 MED FILL — CHLORTHALIDONE 25 MG TABS: 25 | 30 days supply | Qty: 30 | Fill #0

## 2019-10-01 ENCOUNTER — Other Ambulatory Visit: Payer: Self-pay | Admitting: Certified Nurse Midwife

## 2019-10-01 MED ORDER — VALACYCLOVIR HCL 500 MG PO TABS: ORAL_TABLET | ORAL | 2 refills | Status: AC

## 2019-10-01 MED FILL — TRAVOPROST (BAK FREE) 0.004: 0.004 | 25 days supply | Qty: 3 | Fill #2

## 2019-10-01 NOTE — Telephone Encounter (Signed)
Patient requesting refill on valacyclovir. Hannah Gibson long outpatient 336 [phone]

## 2019-10-01 NOTE — Telephone Encounter (Signed)
Medication refill request: valtrex Last AEX:  08-17-2019 Next AEX: 08-18-2020 Last MMG (if hormonal medication request): n/a Refill authorized: please approve if appropriate

## 2019-10-01 NOTE — Telephone Encounter (Signed)
Patient aware that rx has been sent to provider to approve.

## 2019-10-02 ENCOUNTER — Encounter (INDEPENDENT_AMBULATORY_CARE_PROVIDER_SITE_OTHER): Payer: Self-pay | Admitting: Family Medicine

## 2019-10-02 ENCOUNTER — Ambulatory Visit (INDEPENDENT_AMBULATORY_CARE_PROVIDER_SITE_OTHER): Payer: 59 | Admitting: Family Medicine

## 2019-10-02 ENCOUNTER — Telehealth: Payer: Self-pay | Admitting: Certified Nurse Midwife

## 2019-10-02 ENCOUNTER — Other Ambulatory Visit: Payer: Self-pay

## 2019-10-02 VITALS — BP 135/82 | HR 87 | Temp 98.1°F | Ht 65.0 in | Wt 253.0 lb

## 2019-10-02 DIAGNOSIS — Z6841 Body Mass Index (BMI) 40.0 and over, adult: Secondary | ICD-10-CM | POA: Diagnosis not present

## 2019-10-02 DIAGNOSIS — I10 Essential (primary) hypertension: Secondary | ICD-10-CM | POA: Diagnosis not present

## 2019-10-02 DIAGNOSIS — Z9189 Other specified personal risk factors, not elsewhere classified: Secondary | ICD-10-CM | POA: Diagnosis not present

## 2019-10-02 DIAGNOSIS — E118 Type 2 diabetes mellitus with unspecified complications: Secondary | ICD-10-CM

## 2019-10-02 DIAGNOSIS — E7849 Other hyperlipidemia: Secondary | ICD-10-CM

## 2019-10-02 DIAGNOSIS — E559 Vitamin D deficiency, unspecified: Secondary | ICD-10-CM

## 2019-10-02 MED FILL — VALACYCLOVIR HCL 500 MG TAB: 500 | 10 days supply | Qty: 30 | Fill #0

## 2019-10-02 NOTE — Telephone Encounter (Signed)
Valtrex 500 mg bid x 3 days at onset of outbreak

## 2019-10-02 NOTE — Telephone Encounter (Signed)
Call returned to pharmacy, spoke with Amberly. Advised of Valtrex Rx as seen below. Rx read back and confirmed.    Encounter closed.

## 2019-10-02 NOTE — Telephone Encounter (Signed)
Aaron Edelman at [facility] calling regarding prescription for patient. States no directions and just wants to clarify.

## 2019-10-02 NOTE — Telephone Encounter (Signed)
Hannah Gibson, CNM -please clarify Valtrex 500 mg tab instructions?

## 2019-10-02 NOTE — Telephone Encounter (Signed)
Rx was sent in. 

## 2019-10-03 MED ORDER — VITAMIN D (ERGOCALCIFEROL) 1.25 MG (50000 UNIT) PO CAPS: 50000.0000 [IU] | ORAL_CAPSULE | ORAL | 0 refills | Status: DC

## 2019-10-03 MED ORDER — CHLORTHALIDONE 25 MG PO TABS: 25.0000 mg | ORAL_TABLET | Freq: Every day | ORAL | 0 refills | Status: DC

## 2019-10-03 MED ORDER — METFORMIN HCL 500 MG PO TABS: 500.0000 mg | ORAL_TABLET | Freq: Two times a day (BID) | ORAL | 0 refills | Status: DC

## 2019-10-03 MED ORDER — ATORVASTATIN CALCIUM 10 MG PO TABS: 10.0000 mg | ORAL_TABLET | Freq: Every day | ORAL | 0 refills | Status: DC

## 2019-10-03 MED FILL — VIT D2 1.25 MG (50,000 UNIT: 1.25 MG | 84 days supply | Qty: 6 | Fill #0

## 2019-10-03 MED FILL — metFORMIN HCL 500 MG TABS: 500 | 30 days supply | Qty: 60 | Fill #0

## 2019-10-03 MED FILL — ATORVASTATIN 10 MG TABLET: 10 | 30 days supply | Qty: 30 | Fill #0

## 2019-10-04 NOTE — Progress Notes (Signed)
Chief Complaint: OBESITY Hannah Gibson is here to discuss her progress with her obesity treatment plan along with follow-up of her obesity related diagnoses. Hannah Gibson is on the Category 2 Plan and states she is following her eating plan approximately 50% of the time. Hannah Gibson states she is doing 0 minutes 0 times per week.  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 On work days, she drinks protein shakes before work, she "scarfs down" lunch which is 2 vegetables and 1 meat. She is not usually eating after work.  Subjective:   1. Essential hypertension Hannah Gibson's hypertension is associated with diabetes mellitus. Her blood pressure is 135/82 today. Review: no chest pain on exertion, no dyspnea on exertion, no swelling of ankles.   BP Readings from Last 3 Encounters:  10/02/19 135/82  09/11/19 137/83  08/29/19 109/69   2. Other hyperlipidemia Hannah Gibson's hyperlipidemia is associated with diabetes mellitus. She is taking Lipitor 10 mg daily.  3. Vitamin D deficiency Hannah Gibson is taking prescription Vit D. Last Vit D level was 67.2.  4. Controlled type 2 diabetes mellitus with complication, without long-term current use of insulin (Hannah Gibson) Hannah Gibson's last A1c was 6.4 on 05/17/2019 (4 months ago). She is taking metformin.  5. At risk for heart disease Hannah Gibson is at a higher than average risk for cardiovascular disease due to obesity. Reviewed: taking medications as instructed, no medication side effects noted, no TIA's, no chest pain on exertion, no dyspnea on exertion and no swelling of ankles.  Assessment/Plan:   1. Essential hypertension Hannah Gibson is working on healthy weight loss and exercise to improve blood pressure  control. We will watch for signs of hypotension as she continues her lifestyle modifications.  Orders - chlorthalidone (HYGROTON) 25 MG tablet; Take 1 tablet (25 mg total) by mouth daily.  Dispense: 30 tablet; Refill: 0. We will continue to monitor.  2. Other hyperlipidemia Cardiovascular risk and specific lipid/LDL goals reviewed.  We discussed several lifestyle modifications today and Hannah Gibson will continue to work on diet, exercise and weight loss efforts. Orders and follow up as documented in patient record.   Counseling Intensive lifestyle modifications are the first line treatment for this issue. . Dietary changes: Increase soluble fiber. Decrease simple carbohydrates. . Exercise changes: Moderate to vigorous-intensity aerobic activity 150 minutes per week if tolerated. . Lipid-lowering medications: see documented in medical record.  Orders - atorvastatin (LIPITOR) 10 MG tablet; Take 1 tablet (10 mg total) by mouth daily.  Dispense: 30 tablet; Refill: 0. We will continue to monitor.  3. Vitamin D deficiency Low Vitamin D level contributes to fatigue and are associated with obesity, breast, and colon cancer. She agrees to continue to take prescription Vitamin D @50 ,000 IU every week and will follow-up for routine testing of vitamin D, at least 2-3 times per year to avoid over-replacement.  Orders - Vitamin D, Ergocalciferol, (DRISDOL) 1.25 MG (50000 UT) CAPS capsule; Take 1 capsule (50,000 Units total) by mouth every 14 (fourteen) days.  Dispense: 6 capsule; Refill: 0. We will continue to monitor.  4. Controlled type 2 diabetes mellitus with complication, without long-term current use of insulin (Hannah Gibson) Hannah Gibson has been given diabetes education by myself today. Good blood sugar control is important to decrease the likelihood of  diabetic complications such as nephropathy, neuropathy, limb loss, blindness, coronary artery disease, and death. Intensive lifestyle modification including  diet, exercise and weight loss were discussed as the first line treatment for diabetes.   Orders - metFORMIN (GLUCOPHAGE) 500 MG tablet; Take 1 tablet (500 mg total) by mouth 2 (two) times daily.  Dispense: 60 tablet; Refill: 0. We will continue to monitor.  5. At risk for heart disease Hannah Gibson was given (~15 minutes) coronary artery disease prevention counseling today. She is 59 y.o. female and has risk factors for heart disease including obesity. We discussed intensive lifestyle modifications today with an emphasis on specific weight loss instructions and strategies.   6. Class 3 severe obesity with serious comorbidity and body mass index (BMI) of 40.0 to 44.9 in adult, unspecified obesity type (Hannah Gibson) Hannah Gibson is currently in the action stage of change. As such, her goal is to continue with weight loss efforts. She has agreed to Category 2 Plan.   We discussed the following exercise goals today: For substantial health benefits, adults should do at least 150 minutes (2 hours and 30 minutes) a week of moderate-intensity, or 75 minutes (1 hour and 15 minutes) a week of vigorous-intensity aerobic physical activity, or an equivalent combination of moderate- and vigorous-intensity aerobic activity. Aerobic activity should be performed in episodes of at least 10 minutes, and preferably, it should be spread throughout the week. Adults should also include muscle-strengthening activities that involve all major muscle groups on 2 or more days a week.  We discussed the following behavioral modification strategies today: increasing lean protein intake and increasing water intake.  Hannah Gibson has agreed to follow-up with our clinic in 2 weeks. She was informed of the importance of frequent follow-up visits to maximize her success with intensive lifestyle modifications for her multiple health conditions.  Objective:   Blood pressure 135/82, pulse 87, temperature 98.1 F (36.7 C), temperature source  Oral, height 5\' 5"  (1.651 m), weight 253 lb (114.8 kg), last menstrual period 12/09/1997, SpO2 99 %. Body mass index is 42.1 kg/m.  General: Cooperative, alert, well developed, in no acute distress. HEENT: Conjunctivae and lids unremarkable. Neck: No thyromegaly.  Cardiovascular: Regular rhythm.  Lungs: Normal work of breathing. Extremities: No edema.  Neurologic: No focal deficits.   Lab Results  Component Value Date   CREATININE 1.06 (H) 05/17/2019   BUN 25 (H) 05/17/2019   NA 140 05/17/2019   K 4.8 05/17/2019   CL 99 05/17/2019   CO2 25 05/17/2019   Lab Results  Component Value Date   ALT 21 05/17/2019   AST 19 05/17/2019   ALKPHOS 91 05/17/2019   BILITOT 0.2 05/17/2019   Lab Results  Component Value Date   HGBA1C 6.4 (H) 05/17/2019   HGBA1C 6.6 (H) 10/11/2018   Lab Results  Component Value Date   INSULIN 11.5 10/11/2018   Lab Results  Component Value Date   TSH 1.880 10/11/2018   Lab Results  Component Value Date   CHOL 206 (H) 05/17/2019   HDL 47 05/17/2019   LDLCALC 144 (H) 05/17/2019   TRIG 73 05/17/2019   CHOLHDL 3.7 10/11/2018   Lab Results  Component Value Date   WBC 5.9 07/28/2018   HGB 14.0 07/28/2018   HCT 43.0 07/28/2018   MCV 88 07/28/2018   Attestation Statements:   Reviewed by clinician on day of visit: allergies, medications, problem list, medical history, surgical history, family history, social history and previous encounter notes.  This visit occurred  during the SARS-CoV-2 public health emergency. Safety protocols were in place, including screening questions prior to the visit, additional usage of staff PPE, and extensive cleaning of exam room while observing appropriate contact time as indicated for disinfecting solutions. (CPT [id number])  I, Trixie Dredge, am acting as transcriptionist for Briscoe Deutscher, DO.  I have reviewed the above documentation for accuracy and completeness, and I agree with the above. Briscoe Deutscher, DO

## 2019-10-08 MED FILL — CHLORTHALIDONE 25 MG TABS: 25 | 30 days supply | Qty: 30 | Fill #0

## 2019-10-17 ENCOUNTER — Ambulatory Visit (INDEPENDENT_AMBULATORY_CARE_PROVIDER_SITE_OTHER): Payer: 59 | Admitting: Family Medicine

## 2019-10-18 ENCOUNTER — Encounter (INDEPENDENT_AMBULATORY_CARE_PROVIDER_SITE_OTHER): Payer: Self-pay | Admitting: Family Medicine

## 2019-10-18 ENCOUNTER — Other Ambulatory Visit: Payer: Self-pay

## 2019-10-18 ENCOUNTER — Ambulatory Visit (INDEPENDENT_AMBULATORY_CARE_PROVIDER_SITE_OTHER): Payer: 59 | Admitting: Family Medicine

## 2019-10-18 VITALS — BP 110/71 | HR 74 | Temp 98.0°F | Ht 65.0 in | Wt 253.0 lb

## 2019-10-18 DIAGNOSIS — E119 Type 2 diabetes mellitus without complications: Secondary | ICD-10-CM

## 2019-10-18 DIAGNOSIS — I10 Essential (primary) hypertension: Secondary | ICD-10-CM | POA: Diagnosis not present

## 2019-10-18 DIAGNOSIS — E559 Vitamin D deficiency, unspecified: Secondary | ICD-10-CM

## 2019-10-18 DIAGNOSIS — Z6841 Body Mass Index (BMI) 40.0 and over, adult: Secondary | ICD-10-CM

## 2019-10-18 DIAGNOSIS — E7849 Other hyperlipidemia: Secondary | ICD-10-CM | POA: Diagnosis not present

## 2019-10-18 MED ORDER — CHLORTHALIDONE 25 MG PO TABS: 25.0000 mg | ORAL_TABLET | Freq: Every day | ORAL | 0 refills | Status: DC

## 2019-10-18 NOTE — Progress Notes (Signed)
Chief Complaint:   OBESITY Hannah Gibson is here to discuss her progress with her obesity treatment plan along with follow-up of her obesity related diagnoses. Hannah Gibson is on the Category 2 Plan and states she is following her eating plan approximately 85% of the time. Hannah Gibson states she is exercising for 0 minutes 0 times per week.  Today's visit was #: 21 Starting weight: 281 lbs Starting date: 10/11/2018 Today's weight: 253 lbs Today's date: 10/18/2019 Total lbs lost to date: 28 lbs Total lbs lost since last in-office visit: 0  Interim History: Elita still occasionally skips dinner on her work days. She eats dinner for lunch on work days. She tends to skip lunch on her off days.    Subjective:   1. Essential hypertension Blood pressure is well-controlled on chlorthalidone.  BP Readings from Last 3 Encounters:  10/18/19 110/71  10/02/19 135/82  09/11/19 137/83   2. Type 2 diabetes mellitus without complication, without long-term current use of insulin (HCC) She does not check CBGs.    On metformin only.  Well controlled.  Lab Results  Component Value Date   HGBA1C 6.4 (H) 05/17/2019   HGBA1C 6.6 (H) 10/11/2018   Lab Results  Component Value Date   LDLCALC 144 (H) 05/17/2019   CREATININE 1.06 (H) 05/17/2019   Lab Results  Component Value Date   INSULIN 11.5 10/11/2018   3. Other hyperlipidemia Hannah Gibson has hyperlipidemia and has been trying to improve her cholesterol levels with intensive lifestyle modification including a low saturated fat diet, exercise and weight loss. Last LDL elevated at 144 and HDL slightly low at 47.  She is taking atorvastatin.  Lab Results  Component Value Date   ALT 21 05/17/2019   AST 19 05/17/2019   ALKPHOS 91 05/17/2019   BILITOT 0.2 05/17/2019   Lab Results  Component Value Date   CHOL 206 (H) 05/17/2019   HDL 47 05/17/2019   LDLCALC 144 (H) 05/17/2019   TRIG 73 05/17/2019   CHOLHDL 3.7 10/11/2018   4. Vitamin D  deficiency Hannah Gibson's Vitamin D level was 67.2 on 05/17/2019.  This is at goal.  She is currently taking Rx vit 50K every 2 weeks.  Assessment/Plan:   1. Essential hypertension Hannah Gibson is working on healthy weight loss and exercise to improve blood pressure control. We will watch for signs of hypotension as she continues her lifestyle modifications. - Comprehensive metabolic panel - chlorthalidone (HYGROTON) 25 MG tablet; Take 1 tablet (25 mg total) by mouth daily.  Dispense: 30 tablet; Refill: 0  2. Type 2 diabetes mellitus without complication, without long-term current use of insulin (HCC) Continue metformin. We will check A1c, fasting insulin and glucose today.   3. Other hyperlipidemia Continue atorvastatin and check FLP today.   4. Vitamin D deficiency Low Vitamin D level contributes to fatigue and are associated with obesity, breast, and colon cancer. She agrees to continue to take prescription Vitamin D @50 ,000 IU every week and will follow-up for routine testing of Vitamin D, at least 2-3 times per year to avoid over-replacement. - VITAMIN D 25 Hydroxy (Vit-D Deficiency, Fractures)  5. Class 3 severe obesity with serious comorbidity and body mass index (BMI) of 40.0 to 44.9 in adult, unspecified obesity type (HCC) Hannah Gibson is currently in the action stage of change. As such, her goal is to continue with weight loss efforts. She has agreed to the Category 2 Plan and keeping a food journal and adhering to recommended goals of 300-400 calories and  30 grams of protein at lunch.   Discussed possibly picking up food for dinner.  My journal for every meal (she eats lunch for dinner).  Exercise goals: has not been prescribed exercise at this time.  Behavioral modification strategies: increasing lean protein intake, no skipping meals, meal planning and cooking strategies and planning for success.  Hannah Gibson has agreed to follow-up with our clinic in 2 weeks. She was informed of the  importance of frequent follow-up visits to maximize her success with intensive lifestyle modifications for her multiple health conditions.   Hannah Gibson was informed we would discuss her lab results at her next visit unless there is a critical issue that needs to be addressed sooner. Hannah Gibson agreed to keep her next visit at the agreed upon time to discuss these results.  Objective:   Blood pressure 110/71, pulse 74, temperature 98 F (36.7 C), temperature source Oral, height 5\' 5"  (1.651 m), weight 253 lb (114.8 kg), last menstrual period 12/09/1997, SpO2 99 %. Body mass index is 42.1 kg/m.  General: Cooperative, alert, well developed, in no acute distress. HEENT: Conjunctivae and lids unremarkable. Cardiovascular: Regular rhythm.  Lungs: Normal work of breathing. Neurologic: No focal deficits.   Lab Results  Component Value Date   CREATININE 1.06 (H) 05/17/2019   BUN 25 (H) 05/17/2019   NA 140 05/17/2019   K 4.8 05/17/2019   CL 99 05/17/2019   CO2 25 05/17/2019   Lab Results  Component Value Date   ALT 21 05/17/2019   AST 19 05/17/2019   ALKPHOS 91 05/17/2019   BILITOT 0.2 05/17/2019   Lab Results  Component Value Date   HGBA1C 6.4 (H) 05/17/2019   HGBA1C 6.6 (H) 10/11/2018   Lab Results  Component Value Date   INSULIN 11.5 10/11/2018   Lab Results  Component Value Date   TSH 1.880 10/11/2018   Lab Results  Component Value Date   CHOL 206 (H) 05/17/2019   HDL 47 05/17/2019   LDLCALC 144 (H) 05/17/2019   TRIG 73 05/17/2019   CHOLHDL 3.7 10/11/2018   Lab Results  Component Value Date   WBC 5.9 07/28/2018   HGB 14.0 07/28/2018   HCT 43.0 07/28/2018   MCV 88 07/28/2018   Attestation Statements:   Reviewed by clinician on day of visit: allergies, medications, problem list, medical history, surgical history, family history, social history, and previous encounter notes.  I, Water quality scientist, CMA, am acting as Location manager for [employer], FNP-C.  I have  reviewed the above documentation for accuracy and completeness, and I agree with the above. - Georgianne Fick, FNP

## 2019-10-19 ENCOUNTER — Encounter (INDEPENDENT_AMBULATORY_CARE_PROVIDER_SITE_OTHER): Payer: Self-pay | Admitting: Family Medicine

## 2019-10-19 LAB — COMPREHENSIVE METABOLIC PANEL
ALT: 25 IU/L (ref 0–32)
AST: 18 IU/L (ref 0–40)
Albumin/Globulin Ratio: 1.3 (ref 1.2–2.2)
Albumin: 4.3 g/dL (ref 3.8–4.9)
Alkaline Phosphatase: 117 IU/L (ref 39–117)
BUN/Creatinine Ratio: 24 — ABNORMAL HIGH (ref 9–23)
BUN: 25 mg/dL — ABNORMAL HIGH (ref 6–24)
Bilirubin Total: 0.3 mg/dL (ref 0.0–1.2)
CO2: 23 mmol/L (ref 20–29)
Calcium: 10 mg/dL (ref 8.7–10.2)
Chloride: 101 mmol/L (ref 96–106)
Creatinine, Ser: 1.05 mg/dL — ABNORMAL HIGH (ref 0.57–1.00)
GFR calc Af Amer: 69 mL/min/{1.73_m2} (ref >59)
GFR calc non Af Amer: 60 mL/min/{1.73_m2} (ref >59)
Globulin, Total: 3.3 g/dL (ref 1.5–4.5)
Glucose: 96 mg/dL (ref 65–99)
Potassium: 4.1 mmol/L (ref 3.5–5.2)
Sodium: 140 mmol/L (ref 134–144)
Total Protein: 7.6 g/dL (ref 6.0–8.5)

## 2019-10-19 LAB — LIPID PANEL WITH LDL/HDL RATIO
Cholesterol, Total: 168 mg/dL (ref 100–199)
HDL: 46 mg/dL (ref >39)
LDL Chol Calc (NIH): 109 mg/dL — ABNORMAL HIGH (ref 0–99)
LDL/HDL Ratio: 2.4 ratio (ref 0.0–3.2)
Triglycerides: 65 mg/dL (ref 0–149)
VLDL Cholesterol Cal: 13 mg/dL (ref 5–40)

## 2019-10-19 LAB — HEMOGLOBIN A1C
Est. average glucose Bld gHb Est-mCnc: 137 mg/dL
Hgb A1c MFr Bld: 6.4 % — ABNORMAL HIGH (ref 4.8–5.6)

## 2019-10-19 LAB — INSULIN, RANDOM: INSULIN: 13.3 u[IU]/mL (ref 2.6–24.9)

## 2019-10-19 LAB — VITAMIN D 25 HYDROXY (VIT D DEFICIENCY, FRACTURES): Vit D, 25-Hydroxy: 63.5 ng/mL (ref 30.0–100.0)

## 2019-10-19 MED FILL — CHLORTHALIDONE 25 MG TABS: 25 | 30 days supply | Qty: 30 | Fill #0

## 2019-10-23 ENCOUNTER — Telehealth: Payer: Self-pay | Admitting: Podiatry

## 2019-10-23 ENCOUNTER — Ambulatory Visit: Payer: Self-pay

## 2019-10-23 DIAGNOSIS — J309 Allergic rhinitis, unspecified: Secondary | ICD-10-CM | POA: Diagnosis not present

## 2019-10-23 DIAGNOSIS — M722 Plantar fascial fibromatosis: Secondary | ICD-10-CM

## 2019-10-23 DIAGNOSIS — I10 Essential (primary) hypertension: Secondary | ICD-10-CM | POA: Diagnosis not present

## 2019-10-23 DIAGNOSIS — Z86010 Personal history of colonic polyps: Secondary | ICD-10-CM | POA: Diagnosis not present

## 2019-10-23 DIAGNOSIS — J45909 Unspecified asthma, uncomplicated: Secondary | ICD-10-CM | POA: Diagnosis not present

## 2019-10-23 DIAGNOSIS — Z0000 Encounter for general adult medical examination without abnormal findings: Secondary | ICD-10-CM | POA: Diagnosis not present

## 2019-10-23 DIAGNOSIS — E7800 Pure hypercholesterolemia, unspecified: Secondary | ICD-10-CM | POA: Diagnosis not present

## 2019-10-23 DIAGNOSIS — E1169 Type 2 diabetes mellitus with other specified complication: Secondary | ICD-10-CM | POA: Diagnosis not present

## 2019-10-23 NOTE — Telephone Encounter (Signed)
Pt called and left message wanting to discuss a second pair of orthotics.  Returned call and left message and pt called back she is wanting another pair of orthotics just like the last and since it has not been 6 months she is getting the second pair for 219.00 cash pay.

## 2019-11-01 ENCOUNTER — Encounter (INDEPENDENT_AMBULATORY_CARE_PROVIDER_SITE_OTHER): Payer: Self-pay | Admitting: Family Medicine

## 2019-11-01 ENCOUNTER — Ambulatory Visit (INDEPENDENT_AMBULATORY_CARE_PROVIDER_SITE_OTHER): Payer: 59 | Admitting: Family Medicine

## 2019-11-01 ENCOUNTER — Other Ambulatory Visit: Payer: Self-pay

## 2019-11-01 ENCOUNTER — Encounter: Payer: Self-pay | Admitting: Allergy

## 2019-11-01 ENCOUNTER — Ambulatory Visit: Payer: 59 | Admitting: Allergy

## 2019-11-01 VITALS — BP 140/90 | HR 59 | Temp 97.6°F | Resp 18 | Ht 65.0 in | Wt 259.6 lb

## 2019-11-01 VITALS — BP 114/72 | HR 59 | Temp 97.8°F | Ht 65.0 in | Wt 254.0 lb

## 2019-11-01 DIAGNOSIS — Z9189 Other specified personal risk factors, not elsewhere classified: Secondary | ICD-10-CM | POA: Diagnosis not present

## 2019-11-01 DIAGNOSIS — Z6841 Body Mass Index (BMI) 40.0 and over, adult: Secondary | ICD-10-CM

## 2019-11-01 DIAGNOSIS — J4550 Severe persistent asthma, uncomplicated: Secondary | ICD-10-CM

## 2019-11-01 DIAGNOSIS — R7989 Other specified abnormal findings of blood chemistry: Secondary | ICD-10-CM | POA: Diagnosis not present

## 2019-11-01 DIAGNOSIS — E7849 Other hyperlipidemia: Secondary | ICD-10-CM | POA: Diagnosis not present

## 2019-11-01 DIAGNOSIS — J310 Chronic rhinitis: Secondary | ICD-10-CM

## 2019-11-01 MED ORDER — ALBUTEROL SULFATE HFA 108 (90 BASE) MCG/ACT IN AERS: 2.0000 | INHALATION_SPRAY | Freq: Four times a day (QID) | RESPIRATORY_TRACT | 1 refills | Status: AC | PRN

## 2019-11-01 MED ORDER — MONTELUKAST SODIUM 10 MG PO TABS: 10.0000 mg | ORAL_TABLET | Freq: Every day | ORAL | 5 refills | Status: DC

## 2019-11-01 MED ORDER — BUDESONIDE-FORMOTEROL FUMARATE 160-4.5 MCG/ACT IN AERO: 2.0000 | INHALATION_SPRAY | Freq: Two times a day (BID) | RESPIRATORY_TRACT | 5 refills | Status: DC

## 2019-11-01 MED ORDER — ATORVASTATIN CALCIUM 20 MG PO TABS: 20.0000 mg | ORAL_TABLET | Freq: Every day | ORAL | 0 refills | Status: DC

## 2019-11-01 MED FILL — SYMBICORT 160-4.5 MCG INH: 160-4.5 | 30 days supply | Qty: 10 | Fill #0

## 2019-11-01 MED FILL — ATORVASTATIN 20 MG TABLET: 20 | 30 days supply | Qty: 30 | Fill #0

## 2019-11-01 MED FILL — MONTELUKAST SOD 10 MG TAB: 10 | 30 days supply | Qty: 30 | Fill #0

## 2019-11-01 MED FILL — ALBUTEROL SULFATE HFA 108 (: 108 (90 BAS | 25 days supply | Qty: 18 | Fill #0

## 2019-11-01 NOTE — Progress Notes (Signed)
Follow-up Note  RE: Hannah Gibson MRN: [id number] DOB: 08-18-1961 Date of Office Visit: 11/01/2019   History of present illness: Hannah Gibson is a 59 y.o. female presenting today for follow-up of asthma and non-allergic rhinitis.  She was last seen in the office on 11/10/2018 my myself.  She states she has not had any major health changes, surgeries or hospitalizations since her last visit.  She states she has been doing well.  Regards to her asthma she states she has not needed to use her albuterol in the past year.  She did run out of Singulair several months ago and has had increasing nasal congestion and drainage with her allergies.  She does continue on Symbicort 160 mcg 1 puff twice a day.  Denies any daytime or nighttime symptoms.  No systemic steroid needs or ED or urgent care visits. With her chronic nonallergic rhinitis she does state that [facility] is helpful however it is about $100 or so to get it refilled.    Review of systems: Review of Systems  Constitutional: Negative.   HENT: Positive for congestion.   Eyes: Negative.   Respiratory: Negative.   Cardiovascular: Negative.   Gastrointestinal: Negative.   Musculoskeletal: Negative.   Skin: Negative.   Neurological: Negative.     All other systems negative unless noted above in HPI  Past medical/social/surgical/family history have been reviewed and are unchanged unless specifically indicated below.  No changes  Medication List: Current Outpatient Medications  Medication Sig Dispense Refill  . atorvastatin (LIPITOR) 10 MG tablet Take 1 tablet (10 mg total) by mouth daily. 30 tablet 0  . budesonide-formoterol (SYMBICORT) 160-4.5 MCG/ACT inhaler Inhale 2 puffs into the lungs 2 (two) times daily. 1 Inhaler 5  . chlorthalidone (HYGROTON) 25 MG tablet Take 1 tablet (25 mg total) by mouth daily. 30 tablet 0  . DUEXIS 800-26.6 MG TABS Take 1 tablet by mouth 3 (three) times daily.  6  . metFORMIN (GLUCOPHAGE) 500  MG tablet Take 1 tablet (500 mg total) by mouth 2 (two) times daily. 60 tablet 0  . montelukast (SINGULAIR) 10 MG tablet Take 1 tablet (10 mg total) by mouth at bedtime. 30 tablet 5  . Multiple Vitamin (MULTIVITAMIN) tablet Take 1 tablet by mouth daily.    . TRAVATAN Z 0.004 % SOLN ophthalmic solution   3  . valACYclovir (VALTREX) 500 MG tablet valacyclovir 500 mg tablet 90 tablet 2  . Vitamin D, Ergocalciferol, (DRISDOL) 1.25 MG (50000 UT) CAPS capsule Take 1 capsule (50,000 Units total) by mouth every 14 (fourteen) days. 6 capsule 0  . albuterol (VENTOLIN HFA) 108 (90 Base) MCG/ACT inhaler Inhale 2 puffs into the lungs every 6 (six) hours as needed for wheezing or shortness of breath. 18 g 1   No current facility-administered medications for this visit.     Known medication allergies: Allergies  Allergen Reactions  . Latex Itching and Rash     Physical examination: Blood pressure 140/90, pulse (!) 59, temperature 97.6 F (36.4 C), temperature source Temporal, resp. rate 18, height 5\' 5"  (1.651 m), weight 259 lb 9.6 oz (117.8 kg), last menstrual period 12/09/1997, SpO2 97 %.  General: Alert, interactive, in no acute distress. HEENT: PERRLA, TMs pearly gray, turbinates moderately edematous with clear discharge, post-pharynx non erythematous. Neck: Supple without lymphadenopathy. Lungs: Clear to auscultation without wheezing, rhonchi or rales. {no increased work of breathing. CV: Normal S1, S2 without murmurs. Abdomen: Nondistended, nontender. Skin: Warm and dry, without lesions or  rashes. Extremities:  No clubbing, cyanosis or edema. Neuro:   Grossly intact.  Diagnositics/Labs:  Spirometry: FEV1: 2.53L 111%, FVC: 2.84L 99%, ratio consistent with nonobstructive pattern  Assessment and plan:   Asthma, severe persistent - Lung function testing looks great today! - have access to albuterol inhaler 2 puffs every 4-6 hours as needed for cough/wheeze/shortness of breath/chest  tightness.  May use 15-20 minutes prior to activity.   Monitor frequency of use.   - continue singulair 10mg  daily - take at bedtime.   Will refill today - continue symbicort 125mcg 1 puff 1-2 times a day.   If you are not meeting below goal then increase back to 2 puffs twice a day  Asthma control goals:   Full participation in all desired activities (may need albuterol before activity)  Albuterol use two time or less a week on average (not counting use with activity)  Cough interfering with sleep two time or less a month  Oral steroids no more than once a year  No hospitalizations  Nasal congestion, non-allergic - environmental allergy testing has been negative on both skin testing and serum IgE testing - continue use of Xhance nasal spray device.  Use 2 sprays each nostril twice a day as needed.  This spray is Fluticasone however the device allows for deeper deposition of the medication into the sinuses for better improvement.  - continue singulair as above  - recommend use of saline nasal rinse prior to nasal spray use to help clean out the nose and keep nose moisturized   Follow-up in 6-12 months or sooner if needed  I appreciate the opportunity to take part in Hannah Gibson's care. Please do not hesitate to contact me with questions.  Sincerely,   Prudy Feeler, MD Allergy/Immunology [facility] [place]

## 2019-11-01 NOTE — Progress Notes (Signed)
The 10-year ASCVD risk score [name] DC [name], et al., 2013) is: 8.4%   Values used to calculate the score:     Age: 59 years     Sex: Female     Is Non-Hispanic African American: Yes     Diabetic: Yes     Tobacco smoker: No     Systolic Blood Pressure: 99991111 mmHg     Is BP treated: Yes     HDL Cholesterol: 46 mg/dL     Total Cholesterol: 168 mg/dL

## 2019-11-01 NOTE — Patient Instructions (Addendum)
Asthma -Lung function testing looks great today! - have access to albuterol inhaler 2 puffs every 4-6 hours as needed for cough/wheeze/shortness of breath/chest tightness.  May use 15-20 minutes prior to activity.   Monitor frequency of use.   - continue singulair 10mg  daily - take at bedtime.   Will refill today - continue symbicort 176mcg 1 puff 1-2 times a day.   If you are not meeting below goal then increase back to 2 puffs twice a day  Asthma control goals:   Full participation in all desired activities (may need albuterol before activity)  Albuterol use two time or less a week on average (not counting use with activity)  Cough interfering with sleep two time or less a month  Oral steroids no more than once a year  No hospitalizations  Nasal congestion, non-allergic - environmental allergy testing has been negative on both skin testing and serum IgE testing - continue use of Xhance nasal spray device.  Use 2 sprays each nostril twice a day as needed.  This spray is Fluticasone however the device allows for deeper deposition of the medication into the sinuses for better improvement.  - continue singulair as above  - recommend use of saline nasal rinse prior to nasal spray use to help clean out the nose and keep nose moisturized   Follow-up in 6-12 months or sooner if needed

## 2019-11-05 ENCOUNTER — Encounter (INDEPENDENT_AMBULATORY_CARE_PROVIDER_SITE_OTHER): Payer: Self-pay | Admitting: Family Medicine

## 2019-11-05 DIAGNOSIS — R7989 Other specified abnormal findings of blood chemistry: Secondary | ICD-10-CM | POA: Insufficient documentation

## 2019-11-05 NOTE — Progress Notes (Signed)
Chief Complaint:   OBESITY Hannah Gibson is here to discuss her progress with her obesity treatment plan along with follow-up of her obesity related diagnoses. Hannah Gibson is on the Category 2 Plan and keeping a food journal and adhering to recommended goals of 300-400 calories and 30 grams of protein at lunch daily and states she is following her eating plan approximately 80-85% of the time. Hannah Gibson states she is doing 0 minutes 0 times per week.  Today's visit was #: 22 Starting weight: 281 lbs Starting date: 10/11/2018 Today's weight: 254 lbs Today's date: 11/01/2019 Total lbs lost to date: 27 Total lbs lost since last in-office visit: 0  Interim History: Hannah Gibson is working on not skipping meals. She reports carbohydrate cravings. She recently had cheese bread from a pizza place. She reports getting her protein in.  Subjective:   1. Elevated serum creatinine Hannah Gibson's GFR is low normal. She does take Duexis daily for sciatica.  2. Other hyperlipidemia Hannah Gibson's LDL has improved but not at goal (109) and HDL is 46. She is on 10 mg of Lipitor.  3. At risk for heart disease Hannah Gibson is at a higher than average risk for cardiovascular disease due to obesity, HLD, and DM.  Assessment/Plan:   1. Elevated serum creatinine Hannah Gibson is to avoid NSAIDS if possible and drink more water.  2. Other hyperlipidemia Cardiovascular risk and specific lipid/LDL goals reviewed. We discussed several lifestyle modifications today and Hannah Gibson will continue to work on diet, exercise and weight loss efforts. Hannah Gibson agreed to increase atorvastatin to 20 mg q daily with no refill. Orders and follow up as documented in patient record. .  - atorvastatin (LIPITOR) 20 MG tablet; Take 1 tablet (20 mg total) by mouth daily.  Dispense: 30 tablet; Refill: 0  3. At risk for heart disease Hannah Gibson was given approximately 15 minutes of coronary artery disease prevention counseling today. She is 59  y.o. female and has risk factors for heart disease including obesity. We discussed intensive lifestyle modifications today with an emphasis on specific weight loss instructions and strategies.   Repetitive spaced learning was employed today to elicit superior memory formation and behavioral change.  4. Class 3 severe obesity with serious comorbidity and body mass index (BMI) of 40.0 to 44.9 in adult, unspecified obesity type (Hannah Gibson) Hannah Gibson is currently in the action stage of change. As such, her goal is to continue with weight loss efforts. She has agreed to the Category 2 Plan.   Exercise goals: Hannah Gibson will consider starting to exercise over the next few weeks.  Behavioral modification strategies: increasing lean protein intake, decreasing simple carbohydrates, meal planning and cooking strategies and planning for success.  Hannah Gibson has agreed to follow-up with our clinic in 2 weeks. She was informed of the importance of frequent follow-up visits to maximize her success with intensive lifestyle modifications for her multiple health conditions.   Objective:   Blood pressure 114/72, pulse (!) 59, temperature 97.8 F (36.6 C), temperature source Oral, height 5\' 5"  (1.651 m), weight 254 lb (115.2 kg), last menstrual period 12/09/1997, SpO2 99 %. Body mass index is 42.27 kg/m.  General: Cooperative, alert, well developed, in no acute distress. HEENT: Conjunctivae and lids unremarkable. Cardiovascular: Regular rhythm.  Lungs: Normal work of breathing. Neurologic: No focal deficits.   Lab Results  Component Value Date   CREATININE 1.05 (H) 10/18/2019   BUN 25 (H) 10/18/2019   NA 140 10/18/2019   K 4.1 10/18/2019   CL 101 10/18/2019  CO2 23 10/18/2019   Lab Results  Component Value Date   ALT 25 10/18/2019   AST 18 10/18/2019   ALKPHOS 117 10/18/2019   BILITOT 0.3 10/18/2019   Lab Results  Component Value Date   HGBA1C 6.4 (H) 10/18/2019   HGBA1C 6.4 (H) 05/17/2019    HGBA1C 6.6 (H) 10/11/2018   Lab Results  Component Value Date   INSULIN 13.3 10/18/2019   INSULIN 11.5 10/11/2018   Lab Results  Component Value Date   TSH 1.880 10/11/2018   Lab Results  Component Value Date   CHOL 168 10/18/2019   HDL 46 10/18/2019   LDLCALC 109 (H) 10/18/2019   TRIG 65 10/18/2019   CHOLHDL 3.7 10/11/2018   Lab Results  Component Value Date   WBC 5.9 07/28/2018   HGB 14.0 07/28/2018   HCT 43.0 07/28/2018   MCV 88 07/28/2018   No results found for: IRON, TIBC, FERRITIN  Attestation Statements:   Reviewed by clinician on day of visit: allergies, medications, problem list, medical history, surgical history, family history, social history, and previous encounter notes.   Wilhemena Durie, am acting as Location manager for [employer], FNP-C.  I have reviewed the above documentation for accuracy and completeness, and I agree with the above. -  Georgianne Fick, FNP

## 2019-11-07 MED FILL — TRAVOPROST (BAK FREE) 0.004: 0.004 | 25 days supply | Qty: 3 | Fill #3

## 2019-11-13 DIAGNOSIS — E119 Type 2 diabetes mellitus without complications: Secondary | ICD-10-CM | POA: Diagnosis not present

## 2019-11-14 ENCOUNTER — Other Ambulatory Visit: Payer: Self-pay

## 2019-11-14 ENCOUNTER — Encounter (INDEPENDENT_AMBULATORY_CARE_PROVIDER_SITE_OTHER): Payer: Self-pay | Admitting: Family Medicine

## 2019-11-14 ENCOUNTER — Ambulatory Visit (INDEPENDENT_AMBULATORY_CARE_PROVIDER_SITE_OTHER): Payer: 59 | Admitting: Family Medicine

## 2019-11-14 VITALS — BP 117/80 | HR 90 | Temp 98.1°F | Ht 65.0 in | Wt 256.0 lb

## 2019-11-14 DIAGNOSIS — I10 Essential (primary) hypertension: Secondary | ICD-10-CM | POA: Diagnosis not present

## 2019-11-14 DIAGNOSIS — Z6841 Body Mass Index (BMI) 40.0 and over, adult: Secondary | ICD-10-CM | POA: Diagnosis not present

## 2019-11-14 DIAGNOSIS — Z9189 Other specified personal risk factors, not elsewhere classified: Secondary | ICD-10-CM

## 2019-11-14 DIAGNOSIS — E119 Type 2 diabetes mellitus without complications: Secondary | ICD-10-CM | POA: Diagnosis not present

## 2019-11-15 ENCOUNTER — Ambulatory Visit (INDEPENDENT_AMBULATORY_CARE_PROVIDER_SITE_OTHER): Payer: 59 | Admitting: Family Medicine

## 2019-11-17 MED FILL — CHLORTHALIDONE 25 MG TABS: 25 | 30 days supply | Qty: 30 | Fill #0

## 2019-11-18 MED ORDER — CHLORTHALIDONE 25 MG PO TABS: 25.0000 mg | ORAL_TABLET | Freq: Every day | ORAL | 0 refills | Status: DC

## 2019-11-19 ENCOUNTER — Encounter (INDEPENDENT_AMBULATORY_CARE_PROVIDER_SITE_OTHER): Payer: Self-pay | Admitting: Family Medicine

## 2019-11-19 NOTE — Progress Notes (Addendum)
Chief Complaint:   OBESITY Hannah Gibson is here to discuss her progress with her obesity treatment plan along with follow-up of her obesity related diagnoses. Hannah Gibson is on the Category 2 Plan and states she is following her eating plan approximately 80% of the time. Hannah Gibson states she is exercising on the treadmill 30 minutes 1 time per week.  Today's visit was #: 23 Starting weight: 281 lbs Starting date: 10/11/2018 Today's weight: 256 lbs Today's date: 11/14/2019 Total lbs lost to date: 25 Total lbs lost since last in-office visit: 0  Interim History: Hannah Gibson is struggling to stick to the Category 2 plan. She either skips dinner or picks up takeout food.  Subjective:   Essential hypertension. Hypertension is well controlled with chlorthalidone.  BP Readings from Last 3 Encounters:  11/14/19 117/80  11/01/19 114/72  11/01/19 140/90   Lab Results  Component Value Date   CREATININE 1.05 (H) 10/18/2019   CREATININE 1.06 (H) 05/17/2019   CREATININE 0.86 10/11/2018   Type 2 diabetes mellitus without complication, without long-term current use of insulin ([place]). Diabetes is well controlled on metformin only. Last A1c 6.4 on 10/18/2019. She does not check CBG's.  Lab Results  Component Value Date   HGBA1C 6.4 (H) 10/18/2019   HGBA1C 6.4 (H) 05/17/2019   HGBA1C 6.6 (H) 10/11/2018   Lab Results  Component Value Date   LDLCALC 109 (H) 10/18/2019   CREATININE 1.05 (H) 10/18/2019   Lab Results  Component Value Date   INSULIN 13.3 10/18/2019   INSULIN 11.5 10/11/2018   At risk for deficient knowledge of diabetes mellitus.  Hannah Gibson has diabetes but most people who are diabetic lack an understanding of the physiology of diabetes.  Assessment/Plan:   Essential hypertension. Hannah Gibson is working on healthy weight loss and exercise to improve blood pressure control. We will watch for signs of hypotension as she continues her lifestyle modifications. She was  given a refill on her chlorthalidone (HYGROTON) 25 MG tablet daily #30 with 0 refills.  Type 2 diabetes mellitus without complication, without long-term current use of insulin ([place]). Good blood sugar control is important to decrease the likelihood of diabetic complications such as nephropathy, neuropathy, limb loss, blindness, coronary artery disease, and death. Intensive lifestyle modification including diet, exercise and weight loss are the first line of treatment for diabetes. Hannah Gibson will continue metformin as directed.  At risk for deficient knowledge of diabetes mellitus. Hannah Gibson was given approximately 15 minutes of diabetes education and counseling today. WE discussed insulin resistance in depth.  We discussed intensive lifestyle modifications today with an emphasis on weight loss as well as increasing exercise and decreasing simple carbohydrates in her diet.   Class 3 severe obesity with serious comorbidity and body mass index (BMI) of 40.0 to 44.9 in adult, unspecified obesity type ([place]).  Hannah Gibson is currently in the action stage of change. As such, her goal is to continue with weight loss efforts. She has agreed to switch plans and will follow a lower carbohydrate, vegetable and lean protein rich diet plan.   Exercise goals: All adults should avoid inactivity. Some physical activity is better than none, and adults who participate in any amount of physical activity gain some health benefits.  Behavioral modification strategies: increasing lean protein intake, decreasing simple carbohydrates and planning for success.  Hannah Gibson has agreed to follow-up with our clinic in 3 weeks. She was informed of the importance of frequent follow-up visits to maximize her success with intensive lifestyle modifications  for her multiple health conditions.   Objective:   Blood pressure 117/80, pulse 90, temperature 98.1 F (36.7 C), temperature source Oral, height 5\' 5"  (1.651 m), weight 256 lb  (116.1 kg), last menstrual period 12/09/1997, SpO2 100 %. Body mass index is 42.6 kg/m.  General: Cooperative, alert, well developed, in no acute distress. HEENT: Conjunctivae and lids unremarkable. Cardiovascular: Regular rhythm.  Lungs: Normal work of breathing. Neurologic: No focal deficits.   Lab Results  Component Value Date   CREATININE 1.05 (H) 10/18/2019   BUN 25 (H) 10/18/2019   NA 140 10/18/2019   K 4.1 10/18/2019   CL 101 10/18/2019   CO2 23 10/18/2019   Lab Results  Component Value Date   ALT 25 10/18/2019   AST 18 10/18/2019   ALKPHOS 117 10/18/2019   BILITOT 0.3 10/18/2019   Lab Results  Component Value Date   HGBA1C 6.4 (H) 10/18/2019   HGBA1C 6.4 (H) 05/17/2019   HGBA1C 6.6 (H) 10/11/2018   Lab Results  Component Value Date   INSULIN 13.3 10/18/2019   INSULIN 11.5 10/11/2018   Lab Results  Component Value Date   TSH 1.880 10/11/2018   Lab Results  Component Value Date   CHOL 168 10/18/2019   HDL 46 10/18/2019   LDLCALC 109 (H) 10/18/2019   TRIG 65 10/18/2019   CHOLHDL 3.7 10/11/2018   Lab Results  Component Value Date   WBC 5.9 07/28/2018   HGB 14.0 07/28/2018   HCT 43.0 07/28/2018   MCV 88 07/28/2018   No results found for: IRON, TIBC, FERRITIN  Attestation Statements:   Reviewed by clinician on day of visit: allergies, medications, problem list, medical history, surgical history, family history, social history, and previous encounter notes.  IMichaelene Song, am acting as Location manager for [employer], FNP   I have reviewed the above documentation for accuracy and completeness, and I agree with the above. -  Georgianne Fick, FNP

## 2019-11-19 NOTE — Telephone Encounter (Signed)
Please advise 

## 2019-12-01 MED FILL — MONTELUKAST SOD 10 MG TAB: 10 | 30 days supply | Qty: 30 | Fill #1

## 2019-12-05 ENCOUNTER — Other Ambulatory Visit: Payer: Self-pay

## 2019-12-05 ENCOUNTER — Encounter (INDEPENDENT_AMBULATORY_CARE_PROVIDER_SITE_OTHER): Payer: Self-pay | Admitting: Family Medicine

## 2019-12-05 ENCOUNTER — Ambulatory Visit (INDEPENDENT_AMBULATORY_CARE_PROVIDER_SITE_OTHER): Payer: 59 | Admitting: Family Medicine

## 2019-12-05 VITALS — BP 128/85 | HR 59 | Temp 97.7°F | Ht 65.0 in | Wt 252.0 lb

## 2019-12-05 DIAGNOSIS — E1169 Type 2 diabetes mellitus with other specified complication: Secondary | ICD-10-CM | POA: Diagnosis not present

## 2019-12-05 DIAGNOSIS — Z6841 Body Mass Index (BMI) 40.0 and over, adult: Secondary | ICD-10-CM | POA: Diagnosis not present

## 2019-12-05 DIAGNOSIS — Z9189 Other specified personal risk factors, not elsewhere classified: Secondary | ICD-10-CM | POA: Diagnosis not present

## 2019-12-05 DIAGNOSIS — E7849 Other hyperlipidemia: Secondary | ICD-10-CM | POA: Diagnosis not present

## 2019-12-05 MED ORDER — ATORVASTATIN CALCIUM 20 MG PO TABS: 20.0000 mg | ORAL_TABLET | Freq: Every day | ORAL | 0 refills | Status: DC

## 2019-12-05 MED FILL — ATORVASTATIN 20 MG TABLET: 20 | 30 days supply | Qty: 30 | Fill #0

## 2019-12-05 NOTE — Progress Notes (Signed)
The 10-year ASCVD risk score [name] DC [name], et al., 2013) is: 12.1%   Values used to calculate the score:     Age: 59 years     Sex: Female     Is Non-Hispanic African American: Yes     Diabetic: Yes     Tobacco smoker: No     Systolic Blood Pressure: 0000000 mmHg     Is BP treated: Yes     HDL Cholesterol: 46 mg/dL     Total Cholesterol: 168 mg/dL

## 2019-12-05 NOTE — Progress Notes (Signed)
Chief Complaint:   OBESITY Hannah Gibson is here to discuss her progress with her obesity treatment plan along with follow-up of her obesity related diagnoses. Hannah Gibson is on following a lower carbohydrate, vegetable and lean protein rich diet plan and states she is following her eating plan approximately 95% of the time. Hannah Gibson states she is doing 0 minutes 0 times per week.  Today's visit was #: 24 Starting weight: 281 lbs Starting date: 10/11/2018 Today's weight: 252 lbs Today's date: 12/05/2019 Total lbs lost to date: 29 Total lbs lost since last in-office visit: 4  Interim History: Hannah Gibson is happy with the Low carbohydrate plan. She is eating 3 meals per day. She eats cafeteria food at work but thinks she will start packing her lunch.  She has done very well on the plan and has lost 29 lbs total.  Subjective:   1. Other hyperlipidemia Unnamed denies chest pain or shortness of breath. Her last LDL is still elevated at 109, HDL is slightly low at 46, and triglycerides are within normal limits. She denies myalgias. The 10-year ASCVD risk score Hannah Gibson DC Hannah Gibson., et al., 2013) is: 12.1%   Values used to calculate the score:     Age: 59 years     Sex: Female     Is Non-Hispanic African American: Yes     Diabetic: Yes     Tobacco smoker: No     Systolic Blood Pressure: 0000000 mmHg     Is BP treated: Yes     HDL Cholesterol: 46 mg/dL     Total Cholesterol: 168 mg/dL Lab Results  Component Value Date   CHOL 168 10/18/2019   HDL 46 10/18/2019   LDLCALC 109 (H) 10/18/2019   TRIG 65 10/18/2019   CHOLHDL 3.7 10/11/2018     2. Type 2 diabetes mellitus without complication, without long-term current use of insulin ([place]) [name] does not check her sugars at home. Her last A1c was well controlled. She is only on metformin 500 mg BID. She notes not necessarily being hungry. Lab Results  Component Value Date   HGBA1C 6.4 (H) 10/18/2019   HGBA1C 6.4 (H) 05/17/2019   HGBA1C 6.6  (H) 10/11/2018   Lab Results  Component Value Date   LDLCALC 109 (H) 10/18/2019   CREATININE 1.05 (H) 10/18/2019    3. At risk for heart disease Hannah Gibson is at a higher than average risk for cardiovascular disease due to obesity, HTN, and diabetes. Reviewed: no chest pain on exertion, no dyspnea on exertion, and no swelling of ankles.  Assessment/Plan:   1. Other hyperlipidemia Cardiovascular risk and specific lipid/LDL goals reviewed. We discussed several lifestyle modifications today and Hannah Gibson will continue to work on diet, exercise and weight loss efforts. We will refill atorvastatin for 1 month. I amy consider increasing atorvastatin dose after next FLP if LDL not at goal.   - atorvastatin (LIPITOR) 20 MG tablet; Take 1 tablet (20 mg total) by mouth daily.  Dispense: 30 tablet; Refill: 0  2. Type 2 diabetes mellitus without complication, without long-term current use of insulin ([facility]) Good blood sugar control is important to decrease the likelihood of diabetic complications such as nephropathy, neuropathy, limb loss, blindness, coronary artery disease, and death. Intensive lifestyle modification including diet, exercise and weight loss are the first line of treatment for diabetes. Arnett agreed to continue taking metformin, and will continue to monitor.  3. At risk for heart disease Hannah Gibson was given approximately 15 minutes of coronary artery disease  prevention counseling today. She is 59 y.o. female and has risk factors for heart disease including obesity. We discussed intensive lifestyle modifications today with an emphasis on specific weight loss instructions and strategies.   Repetitive spaced learning was employed today to elicit superior memory formation and behavioral change.  4. Class 3 severe obesity with serious comorbidity and body mass index (BMI) of 40.0 to 44.9 in adult, unspecified obesity type ([place]) [name] is currently in the action stage of change. As  such, her goal is to continue with weight loss efforts. She has agreed to following a lower carbohydrate, vegetable and lean protein rich diet plan.   Exercise goals: All adults should avoid inactivity. Some physical activity is better than none, and adults who participate in any amount of physical activity gain some health benefits.  Behavioral modification strategies: meal planning and cooking strategies.  Hannah Gibson has agreed to follow-up with our clinic in 3 weeks. She was informed of the importance of frequent follow-up visits to maximize her success with intensive lifestyle modifications for her multiple health conditions.   Objective:   Blood pressure 128/85, pulse (!) 59, temperature 97.7 F (36.5 C), temperature source Oral, height 5\' 5"  (1.651 m), weight 252 lb (114.3 kg), last menstrual period 12/09/1997, SpO2 99 %. Body mass index is 41.93 kg/m.  General: Cooperative, alert, well developed, in no acute distress. HEENT: Conjunctivae and lids unremarkable. Cardiovascular: Regular rhythm.  Lungs: Normal work of breathing. Neurologic: No focal deficits.   Lab Results  Component Value Date   CREATININE 1.05 (H) 10/18/2019   BUN 25 (H) 10/18/2019   NA 140 10/18/2019   K 4.1 10/18/2019   CL 101 10/18/2019   CO2 23 10/18/2019   Lab Results  Component Value Date   ALT 25 10/18/2019   AST 18 10/18/2019   ALKPHOS 117 10/18/2019   BILITOT 0.3 10/18/2019   Lab Results  Component Value Date   HGBA1C 6.4 (H) 10/18/2019   HGBA1C 6.4 (H) 05/17/2019   HGBA1C 6.6 (H) 10/11/2018   Lab Results  Component Value Date   INSULIN 13.3 10/18/2019   INSULIN 11.5 10/11/2018   Lab Results  Component Value Date   TSH 1.880 10/11/2018   Lab Results  Component Value Date   CHOL 168 10/18/2019   HDL 46 10/18/2019   LDLCALC 109 (H) 10/18/2019   TRIG 65 10/18/2019   CHOLHDL 3.7 10/11/2018   Lab Results  Component Value Date   WBC 5.9 07/28/2018   HGB 14.0 07/28/2018   HCT 43.0  07/28/2018   MCV 88 07/28/2018   No results found for: IRON, TIBC, FERRITIN  Attestation Statements:   Reviewed by clinician on day of visit: allergies, medications, problem list, medical history, surgical history, family history, social history, and previous encounter notes.   Wilhemena Durie, am acting as Location manager for [employer], FNP-C.  I have reviewed the above documentation for accuracy and completeness, and I agree with the above. -  Georgianne Fick, FNP

## 2019-12-06 ENCOUNTER — Encounter (INDEPENDENT_AMBULATORY_CARE_PROVIDER_SITE_OTHER): Payer: Self-pay | Admitting: Family Medicine

## 2019-12-12 ENCOUNTER — Ambulatory Visit (INDEPENDENT_AMBULATORY_CARE_PROVIDER_SITE_OTHER): Payer: 59 | Admitting: Family Medicine

## 2019-12-14 ENCOUNTER — Other Ambulatory Visit (INDEPENDENT_AMBULATORY_CARE_PROVIDER_SITE_OTHER): Payer: Self-pay | Admitting: Family Medicine

## 2019-12-14 ENCOUNTER — Other Ambulatory Visit: Payer: Self-pay | Admitting: Certified Nurse Midwife

## 2019-12-14 DIAGNOSIS — I10 Essential (primary) hypertension: Secondary | ICD-10-CM

## 2019-12-14 MED FILL — VALACYCLOVIR HCL 500 MG TAB: 500 | 30 days supply | Qty: 30 | Fill #0

## 2019-12-14 NOTE — Telephone Encounter (Signed)
Per pharmacy patient still has refills left. rx for valtrex denied today.

## 2019-12-17 ENCOUNTER — Other Ambulatory Visit: Payer: Self-pay | Admitting: Allergy

## 2019-12-17 ENCOUNTER — Encounter: Payer: Self-pay | Admitting: Certified Nurse Midwife

## 2019-12-17 MED ORDER — CHLORTHALIDONE 25 MG PO TABS: 25.0000 mg | ORAL_TABLET | Freq: Every day | ORAL | 0 refills | Status: DC

## 2019-12-17 MED FILL — CHLORTHALIDONE 25 MG TABS: 25 | 30 days supply | Qty: 30 | Fill #0

## 2019-12-18 MED FILL — TRAVOPROST (BAK FREE) 0.004: 0.004 | 25 days supply | Qty: 3 | Fill #0

## 2019-12-20 ENCOUNTER — Telehealth: Payer: Self-pay

## 2019-12-20 NOTE — Telephone Encounter (Signed)
Prior authorization has been submitted for Xhance nasal spray via covermymeds. Currently pending approval/denial.

## 2019-12-21 NOTE — Telephone Encounter (Signed)
Currently pending approval/denial.

## 2019-12-24 MED ORDER — XHANCE 93 MCG/ACT NA EXHU: 2.0000 | INHALANT_SUSPENSION | Freq: Two times a day (BID) | NASAL | 12 refills | Status: DC

## 2019-12-24 NOTE — Addendum Note (Signed)
Addended by: Lucrezia Starch I on: 12/24/2019 11:39 AM   Modules accepted: Orders

## 2019-12-24 NOTE — Telephone Encounter (Signed)
Prescription sent with updated dispensing amount. I also included a note that the prescription was covered by insurance per prior authorization.

## 2019-12-24 NOTE — Telephone Encounter (Signed)
Hannah Gibson has been approved:  Approval dates:  12/22/2019 until 12/20/2020 for a maximum of 12 refills.

## 2019-12-25 ENCOUNTER — Ambulatory Visit (INDEPENDENT_AMBULATORY_CARE_PROVIDER_SITE_OTHER): Payer: 59 | Admitting: Family Medicine

## 2019-12-25 ENCOUNTER — Other Ambulatory Visit: Payer: Self-pay

## 2019-12-25 ENCOUNTER — Encounter (INDEPENDENT_AMBULATORY_CARE_PROVIDER_SITE_OTHER): Payer: Self-pay | Admitting: Family Medicine

## 2019-12-25 VITALS — BP 123/75 | HR 58 | Temp 98.1°F | Ht 65.0 in | Wt 253.0 lb

## 2019-12-25 DIAGNOSIS — I10 Essential (primary) hypertension: Secondary | ICD-10-CM | POA: Diagnosis not present

## 2019-12-25 DIAGNOSIS — Z6841 Body Mass Index (BMI) 40.0 and over, adult: Secondary | ICD-10-CM

## 2019-12-25 DIAGNOSIS — E118 Type 2 diabetes mellitus with unspecified complications: Secondary | ICD-10-CM | POA: Diagnosis not present

## 2019-12-25 DIAGNOSIS — E1159 Type 2 diabetes mellitus with other circulatory complications: Secondary | ICD-10-CM | POA: Diagnosis not present

## 2019-12-25 DIAGNOSIS — Z9189 Other specified personal risk factors, not elsewhere classified: Secondary | ICD-10-CM

## 2019-12-25 DIAGNOSIS — I152 Hypertension secondary to endocrine disorders: Secondary | ICD-10-CM

## 2019-12-25 MED ORDER — METFORMIN HCL 500 MG PO TABS: 500.0000 mg | ORAL_TABLET | Freq: Every day | ORAL | 0 refills | Status: DC

## 2019-12-25 MED FILL — METFORMIN HCL 500 MG TABS: 500 | 30 days supply | Qty: 30 | Fill #0

## 2019-12-25 NOTE — Progress Notes (Signed)
Chief Complaint:   OBESITY Hannah Gibson is here to discuss her progress with her obesity treatment plan along with follow-up of her obesity related diagnoses. Hannah Gibson is on following a lower carbohydrate, vegetable and lean protein rich diet plan and states she is following her eating plan approximately 85% of the time. Hannah Gibson states she is doing 0 minutes 0 times per week.  Today's visit was #: 25 Starting weight: 281 lbs Starting date: 10/11/2018 Today's weight: 253 lbs Today's date: 12/25/2019 Total lbs lost to date: 28 Total lbs lost since last in-office visit: 0  Interim History: Hannah Gibson notes she has been eating more carbohydrates over the past few weeks. She has been experimenting with vegetable preparation. She would like to switch back to Category 2.  Subjective:   1. Controlled type 2 diabetes mellitus with complication, without long-term current use of insulin ([place]) Hannah Gibson's diabetes mellitus is well controlled on metformin only. She notes polyphagia in the evenings. She denies hypoglycemia. Lab Results  Component Value Date   HGBA1C 6.4 (H) 10/18/2019    2. Essential hypertension Hannah Gibson's blood pressure is well controlled on chlorthalidone. BP Readings from Last 3 Encounters:  12/25/19 123/75  12/05/19 128/85  11/14/19 117/80   3. At risk for heart disease Hannah Gibson is at a higher than average risk for cardiovascular disease due to obesity, diabetes, and HLD.  Assessment/Plan:   1. Controlled type 2 diabetes mellitus with complication, without long-term current use of insulin ([facility]) Good blood sugar control is important to decrease the likelihood of diabetic complications such as nephropathy, neuropathy, limb loss, blindness, coronary artery disease, and death. Intensive lifestyle modification including diet, exercise and weight loss are the first line of treatment for diabetes. Hannah Gibson agreed to decrease metformin to 500 mg daily with no  refills.  - metFORMIN (GLUCOPHAGE) 500 MG tablet; Take 1 tablet (500 mg total) by mouth daily with supper.  Dispense: 30 tablet; Refill: 0  2. Essential hypertension Hannah Gibson is working on healthy weight loss and exercise to improve blood pressure control. We will watch for signs of hypotension as she continues her lifestyle modifications. Hannah Gibson will continue chlorthalidone, and she will increase her exercise.  3. At risk for heart disease Hannah Gibson was given approximately 15 minutes of coronary artery disease prevention counseling today. She is 59 y.o. female and has risk factors for heart disease including obesity. We discussed intensive lifestyle modifications today with an emphasis on specific weight loss instructions and strategies.   Repetitive spaced learning was employed today to elicit superior memory formation and behavioral change.  4. Class 3 severe obesity with serious comorbidity and body mass index (BMI) of 40.0 to 44.9 in adult, unspecified obesity type (Hannah Gibson) Hannah Gibson is currently in the action stage of change. As such, her goal is to continue with weight loss efforts. She has agreed to the Category 2 Plan.   Exercise goals: Hannah Gibson is to try to walk for 15-20 minutes 2-3 times per week.  Behavioral modification strategies: decreasing simple carbohydrates and planning for success.  Hannah Gibson has agreed to follow-up with our clinic in 3 weeks. She was informed of the importance of frequent follow-up visits to maximize her success with intensive lifestyle modifications for her multiple health conditions.   Objective:   Blood pressure 123/75, pulse (!) 58, temperature 98.1 F (36.7 C), temperature source Oral, height 5\' 5"  (1.651 m), weight 253 lb (114.8 kg), last menstrual period 12/09/1997, SpO2 99 %. Body mass index is 42.1 kg/m.  General: Cooperative,  alert, well developed, in no acute distress. HEENT: Conjunctivae and lids unremarkable. Cardiovascular: Regular  rhythm.  Lungs: Normal work of breathing. Neurologic: No focal deficits.   Lab Results  Component Value Date   CREATININE 1.05 (H) 10/18/2019   BUN 25 (H) 10/18/2019   NA 140 10/18/2019   K 4.1 10/18/2019   CL 101 10/18/2019   CO2 23 10/18/2019   Lab Results  Component Value Date   ALT 25 10/18/2019   AST 18 10/18/2019   ALKPHOS 117 10/18/2019   BILITOT 0.3 10/18/2019   Lab Results  Component Value Date   HGBA1C 6.4 (H) 10/18/2019   HGBA1C 6.4 (H) 05/17/2019   HGBA1C 6.6 (H) 10/11/2018   Lab Results  Component Value Date   INSULIN 13.3 10/18/2019   INSULIN 11.5 10/11/2018   Lab Results  Component Value Date   TSH 1.880 10/11/2018   Lab Results  Component Value Date   CHOL 168 10/18/2019   HDL 46 10/18/2019   LDLCALC 109 (H) 10/18/2019   TRIG 65 10/18/2019   CHOLHDL 3.7 10/11/2018   Lab Results  Component Value Date   WBC 5.9 07/28/2018   HGB 14.0 07/28/2018   HCT 43.0 07/28/2018   MCV 88 07/28/2018   No results found for: IRON, TIBC, FERRITIN  Attestation Statements:   Reviewed by clinician on day of visit: allergies, medications, problem list, medical history, surgical history, family history, social history, and previous encounter notes.   Wilhemena Durie, am acting as Location manager for [employer], FNP-C.  I have reviewed the above documentation for accuracy and completeness, and I agree with the above. -  Georgianne Fick, FNP

## 2019-12-26 ENCOUNTER — Encounter (INDEPENDENT_AMBULATORY_CARE_PROVIDER_SITE_OTHER): Payer: Self-pay | Admitting: Family Medicine

## 2020-01-06 ENCOUNTER — Other Ambulatory Visit (INDEPENDENT_AMBULATORY_CARE_PROVIDER_SITE_OTHER): Payer: Self-pay | Admitting: Family Medicine

## 2020-01-06 DIAGNOSIS — E7849 Other hyperlipidemia: Secondary | ICD-10-CM

## 2020-01-07 MED ORDER — ATORVASTATIN CALCIUM 20 MG PO TABS: 20.0000 mg | ORAL_TABLET | Freq: Every day | ORAL | 0 refills | Status: DC

## 2020-01-07 MED FILL — MONTELUKAST SOD 10 MG TAB: 10 | 30 days supply | Qty: 30 | Fill #2

## 2020-01-07 MED FILL — ATORVASTATIN 20 MG TABLET: 20 | 30 days supply | Qty: 30 | Fill #0

## 2020-01-16 MED FILL — VALACYCLOVIR HCL 500 MG TAB: 500 | 30 days supply | Qty: 30 | Fill #1

## 2020-01-17 ENCOUNTER — Other Ambulatory Visit: Payer: Self-pay

## 2020-01-17 ENCOUNTER — Ambulatory Visit (INDEPENDENT_AMBULATORY_CARE_PROVIDER_SITE_OTHER): Payer: 59 | Admitting: Family Medicine

## 2020-01-17 ENCOUNTER — Encounter (INDEPENDENT_AMBULATORY_CARE_PROVIDER_SITE_OTHER): Payer: Self-pay | Admitting: Family Medicine

## 2020-01-17 VITALS — BP 110/70 | HR 58 | Temp 98.0°F | Ht 65.0 in | Wt 257.0 lb

## 2020-01-17 DIAGNOSIS — E119 Type 2 diabetes mellitus without complications: Secondary | ICD-10-CM

## 2020-01-17 DIAGNOSIS — Z6841 Body Mass Index (BMI) 40.0 and over, adult: Secondary | ICD-10-CM | POA: Diagnosis not present

## 2020-01-17 DIAGNOSIS — E1159 Type 2 diabetes mellitus with other circulatory complications: Secondary | ICD-10-CM

## 2020-01-17 DIAGNOSIS — I10 Essential (primary) hypertension: Secondary | ICD-10-CM | POA: Diagnosis not present

## 2020-01-17 DIAGNOSIS — Z9189 Other specified personal risk factors, not elsewhere classified: Secondary | ICD-10-CM | POA: Diagnosis not present

## 2020-01-17 DIAGNOSIS — I152 Hypertension secondary to endocrine disorders: Secondary | ICD-10-CM

## 2020-01-17 DIAGNOSIS — E1169 Type 2 diabetes mellitus with other specified complication: Secondary | ICD-10-CM | POA: Diagnosis not present

## 2020-01-17 MED ORDER — CHLORTHALIDONE 25 MG PO TABS: 25.0000 mg | ORAL_TABLET | Freq: Every day | ORAL | 0 refills | Status: DC

## 2020-01-17 MED FILL — CHLORTHALIDONE 25 MG TABS: 25 | 30 days supply | Qty: 30 | Fill #0

## 2020-01-21 ENCOUNTER — Encounter (INDEPENDENT_AMBULATORY_CARE_PROVIDER_SITE_OTHER): Payer: Self-pay | Admitting: Family Medicine

## 2020-01-21 NOTE — Progress Notes (Signed)
Chief Complaint:   OBESITY Hannah Gibson is here to discuss her progress with her obesity treatment plan along with follow-up of her obesity related diagnoses. Hannah Gibson is following a lower carbohydrate, vegetable and lean protein rich diet plan and states she is following her eating plan approximately 60% of the time. Hannah Gibson states she is exercising 0 minutes 0 times per week.  Today's visit was #: 3 Starting weight: 281 lbs Starting date: 10/11/2018 Today's weight: 257 lbs Today's date: 01/17/2020 Total lbs lost to date: 24 Total lbs lost since last in-office visit: 0  Interim History: Hannah Gibson has been following the low carb plan, but has had recent special occasions where she ate out and had alcohol. She reports cooking more frequently over the past few weeks. Her hunger is satisfied on the low carb plan.  Subjective:   Essential hypertension. Hypertension is well controlled. No chest pain or shortness of breath.  BP Readings from Last 3 Encounters:  01/17/20 110/70  12/25/19 123/75  12/05/19 128/85   Lab Results  Component Value Date   CREATININE 1.05 (H) 10/18/2019   CREATININE 1.06 (H) 05/17/2019   CREATININE 0.86 10/11/2018   Type 2 diabetes mellitus without complication, without long-term current use of insulin ([place]). Diabetes is well controlled on metformin. Hannah Gibson does not check her sugars at home. No hypoglycemia.  Lab Results  Component Value Date   HGBA1C 6.4 (H) 10/18/2019   HGBA1C 6.4 (H) 05/17/2019   HGBA1C 6.6 (H) 10/11/2018   Lab Results  Component Value Date   LDLCALC 109 (H) 10/18/2019   CREATININE 1.05 (H) 10/18/2019   Lab Results  Component Value Date   INSULIN 13.3 10/18/2019   INSULIN 11.5 10/11/2018   At risk for constipation. Hannah Gibson is at increased risk for constipation due to being on the low carbohydrate meal plan. Hannah Gibson denies hard, infrequent stools currently.   Assessment/Plan:   Essential hypertension.  Hannah Gibson is working on healthy weight loss and exercise to improve blood pressure control. We will watch for signs of hypotension as she continues her lifestyle modifications. Refill was given for chlorthalidone (HYGROTON) 25 MG tablet QD #30 with 0 refills.  Type 2 diabetes mellitus without complication, without long-term current use of insulin ([place]). Good blood sugar control is important to decrease the likelihood of diabetic complications such as nephropathy, neuropathy, limb loss, blindness, coronary artery disease, and death. Intensive lifestyle modification including diet, exercise and weight loss are the first line of treatment for diabetes. Hannah Gibson will continue metformin as directed.  At risk for constipation. Hannah Gibson was given approximately 15 minutes of counseling today regarding prevention of constipation. She was encouraged to increase water and fiber intake.   Class 3 severe obesity with serious comorbidity and body mass index (BMI) of 40.0 to 44.9 in adult, unspecified obesity type ([place]).  Hannah Gibson is currently in the action stage of change. As such, her goal is to continue with weight loss efforts. She has agreed to following a lower carbohydrate, vegetable and lean protein rich diet plan.   Handout was given on Recipes.  Exercise goals: For substantial health benefits, adults should do at least 150 minutes (2 hours and 30 minutes) a week of moderate-intensity, or 75 minutes (1 hour and 15 minutes) a week of vigorous-intensity aerobic physical activity, or an equivalent combination of moderate- and vigorous-intensity aerobic activity. Aerobic activity should be performed in episodes of at least 10 minutes, and preferably, it should be spread throughout the week.  Behavioral  modification strategies: meal planning and cooking strategies.  Hannah Gibson has agreed to follow-up with our clinic in 3 weeks. She was informed of the importance of frequent follow-up visits to maximize her  success with intensive lifestyle modifications for her multiple health conditions.   Objective:   Blood pressure 110/70, pulse (!) 58, temperature 98 F (36.7 C), temperature source Oral, height 5\' 5"  (1.651 m), weight 257 lb (116.6 kg), last menstrual period 12/09/1997, SpO2 100 %. Body mass index is 42.77 kg/m.  General: Cooperative, alert, well developed, in no acute distress. HEENT: Conjunctivae and lids unremarkable. Cardiovascular: Regular rhythm.  Lungs: Normal work of breathing. Neurologic: No focal deficits.   Lab Results  Component Value Date   CREATININE 1.05 (H) 10/18/2019   BUN 25 (H) 10/18/2019   NA 140 10/18/2019   K 4.1 10/18/2019   CL 101 10/18/2019   CO2 23 10/18/2019   Lab Results  Component Value Date   ALT 25 10/18/2019   AST 18 10/18/2019   ALKPHOS 117 10/18/2019   BILITOT 0.3 10/18/2019   Lab Results  Component Value Date   HGBA1C 6.4 (H) 10/18/2019   HGBA1C 6.4 (H) 05/17/2019   HGBA1C 6.6 (H) 10/11/2018   Lab Results  Component Value Date   INSULIN 13.3 10/18/2019   INSULIN 11.5 10/11/2018   Lab Results  Component Value Date   TSH 1.880 10/11/2018   Lab Results  Component Value Date   CHOL 168 10/18/2019   HDL 46 10/18/2019   LDLCALC 109 (H) 10/18/2019   TRIG 65 10/18/2019   CHOLHDL 3.7 10/11/2018   Lab Results  Component Value Date   WBC 5.9 07/28/2018   HGB 14.0 07/28/2018   HCT 43.0 07/28/2018   MCV 88 07/28/2018   No results found for: IRON, TIBC, FERRITIN  Attestation Statements:   Reviewed by clinician on day of visit: allergies, medications, problem list, medical history, surgical history, family history, social history, and previous encounter notes.  IMichaelene Song, am acting as Location manager for [employer], FNP   I have reviewed the above documentation for accuracy and completeness, and I agree with the above. -  Georgianne Fick, FNP

## 2020-01-22 MED FILL — TRAVOPROST (BAK FREE) 0.004: 0.004 | 25 days supply | Qty: 3 | Fill #1

## 2020-02-01 DIAGNOSIS — H9201 Otalgia, right ear: Secondary | ICD-10-CM | POA: Diagnosis not present

## 2020-02-01 MED FILL — CIPROFLOXACIN-DEXAMETHASONE: 0.3-0.1 | 9 days supply | Qty: 8 | Fill #0

## 2020-02-06 ENCOUNTER — Ambulatory Visit (INDEPENDENT_AMBULATORY_CARE_PROVIDER_SITE_OTHER): Payer: 59 | Admitting: Family Medicine

## 2020-02-06 ENCOUNTER — Encounter (INDEPENDENT_AMBULATORY_CARE_PROVIDER_SITE_OTHER): Payer: Self-pay | Admitting: Family Medicine

## 2020-02-06 ENCOUNTER — Other Ambulatory Visit: Payer: Self-pay

## 2020-02-06 VITALS — BP 103/67 | HR 59 | Temp 97.8°F | Ht 65.0 in | Wt 253.0 lb

## 2020-02-06 DIAGNOSIS — E1159 Type 2 diabetes mellitus with other circulatory complications: Secondary | ICD-10-CM | POA: Diagnosis not present

## 2020-02-06 DIAGNOSIS — I10 Essential (primary) hypertension: Secondary | ICD-10-CM

## 2020-02-06 DIAGNOSIS — E785 Hyperlipidemia, unspecified: Secondary | ICD-10-CM | POA: Diagnosis not present

## 2020-02-06 DIAGNOSIS — Z6841 Body Mass Index (BMI) 40.0 and over, adult: Secondary | ICD-10-CM | POA: Diagnosis not present

## 2020-02-06 DIAGNOSIS — I152 Hypertension secondary to endocrine disorders: Secondary | ICD-10-CM

## 2020-02-06 DIAGNOSIS — E1169 Type 2 diabetes mellitus with other specified complication: Secondary | ICD-10-CM

## 2020-02-06 DIAGNOSIS — Z9189 Other specified personal risk factors, not elsewhere classified: Secondary | ICD-10-CM

## 2020-02-06 MED ORDER — ATORVASTATIN CALCIUM 20 MG PO TABS: 20.0000 mg | ORAL_TABLET | Freq: Every day | ORAL | 0 refills | Status: DC

## 2020-02-06 MED ORDER — CHLORTHALIDONE 25 MG PO TABS: 25.0000 mg | ORAL_TABLET | Freq: Every day | ORAL | 0 refills | Status: DC

## 2020-02-06 MED ORDER — METFORMIN HCL 500 MG PO TABS: 500.0000 mg | ORAL_TABLET | Freq: Every day | ORAL | 0 refills | Status: DC

## 2020-02-06 NOTE — Progress Notes (Signed)
Chief Complaint:   OBESITY Hannah Gibson is here to discuss her progress with her obesity treatment plan along with follow-up of her obesity related diagnoses. Hannah Gibson is on following a lower carbohydrate, vegetable and lean protein rich diet plan and states she is following her eating plan approximately 90% of the time. Hannah Gibson states she is doing 0 minutes 0 times per week.  Today's visit was #: 68 Starting weight: 281 lbs Starting date: 10/11/2018 Today's weight: 253 lbs Today's date: 02/06/2020 Total lbs lost to date: 28 Total lbs lost since last in-office visit: 4  Interim History: Akeylah has tried the exercise bike but it caused knee pain. She is not skipping meals, and she doesn't snack much. She likes the Low carbohydrate plan. And wants to stick with it.  Subjective:   1. Type 2 diabetes mellitus without complication, without long-term current use of insulin (Hannah Gibson) Hannah Gibson diabetes mellitus is well controlled with metformin. We discussed GLP-1 agonists and she will consider these. She denies polyphagia. Lab Results  Component Value Date   HGBA1C 6.4 (H) 10/18/2019    2. Other hyperlipidemia Hannah Gibson denies myalgias. Last LDL was 109, and HDL was low at 46. Her triglycerides were within normal limits. She is on atorvastatin. The 10-year ASCVD risk score Hannah Bussing DC [name], Hannah al., 2013) is: 6%   Values used to calculate the score:     Age: 59 years     Sex: Female     Is Non-Hispanic African American: Yes     Diabetic: Yes     Tobacco smoker: No     Systolic Blood Pressure: [id number] mmHg     Is BP treated: Yes     HDL Cholesterol: 46 mg/dL     Total Cholesterol: 168 mg/dL  3. Essential hypertension Hannah Gibson denies chest pain or shortness of breath. Her blood pressure is well controlled. BP Readings from Last 3 Encounters:  02/06/20 103/67  01/17/20 110/70  12/25/19 123/75     4. At risk for heart disease Hannah Gibson is at a higher than average risk for  cardiovascular disease due to DM, HLD, and obesity.   Assessment/Plan:   1. Type 2 diabetes mellitus without complication, without long-term current use of insulin ([facility]) Good blood sugar control is important to decrease the likelihood of diabetic complications such as nephropathy, neuropathy, limb loss, blindness, coronary artery disease, and death. Intensive lifestyle modification including diet, exercise and weight loss are the first line of treatment for diabetes. We will refill metformin for 1 month.  - metFORMIN (GLUCOPHAGE) 500 MG tablet; Take 1 tablet (500 mg total) by mouth daily with supper.  Dispense: 30 tablet; Refill: 0  2. Other hyperlipidemia Cardiovascular risk and specific lipid/LDL goals reviewed. We discussed several lifestyle modifications today. Antina will continue to work on diet, exercise and weight loss efforts. We will refill atorvastatin for 1 month, Lazariah will increase her exercise to increase her HDL.   - atorvastatin (LIPITOR) 20 MG tablet; Take 1 tablet (20 mg total) by mouth daily.  Dispense: 30 tablet; Refill: 0  3. Essential hypertension Hannah Gibson is working on healthy weight loss and exercise to improve blood pressure control. We will watch for signs of hypotension as she continues her lifestyle modifications. We will refill chlorthalidone for 1 month.  - chlorthalidone (HYGROTON) 25 MG tablet; Take 1 tablet (25 mg total) by mouth daily.  Dispense: 30 tablet; Refill: 0  4. At risk for heart disease Hannah Gibson was given approximately 15 minutes of coronary  artery disease prevention counseling today. She is 59 y.o. female and has risk factors for heart disease including DM, HLD, and obesity.    We discussed intensive lifestyle modifications today with an emphasis on specific weight loss instructions and strategies.   Repetitive spaced learning was employed today to elicit superior memory formation and behavioral change.  5. Class 3 severe obesity with  serious comorbidity and body mass index (BMI) of 40.0 to 44.9 in adult, unspecified obesity type ([place]) Hannah Gibson is currently in the action stage of change. As such, her goal is to continue with weight loss efforts. She has agreed to following a lower carbohydrate, vegetable and lean protein rich diet plan.   Exercise goals: All adults should avoid inactivity. Some physical activity is better than none, and adults who participate in any amount of physical activity gain some health benefits. Hannah Gibson will try other forms of exercise (swimming or elliptical).  Behavioral modification strategies: planning for success.  Hannah Gibson has agreed to follow-up with our clinic in 3 weeks. She was informed of the importance of frequent follow-up visits to maximize her success with intensive lifestyle modifications for her multiple health conditions.   Objective:   Blood pressure 103/67, pulse (!) 59, temperature 97.8 F (36.6 C), temperature source Oral, height 5\' 5"  (1.651 m), weight 253 lb (114.8 kg), last menstrual period 12/09/1997, SpO2 99 %. Body mass index is 42.1 kg/m.  General: Cooperative, alert, well developed, in no acute distress. HEENT: Conjunctivae and lids unremarkable. Cardiovascular: Regular rhythm.  Lungs: Normal work of breathing. Neurologic: No focal deficits.   Lab Results  Component Value Date   CREATININE 1.05 (H) 10/18/2019   BUN 25 (H) 10/18/2019   NA 140 10/18/2019   K 4.1 10/18/2019   CL 101 10/18/2019   CO2 23 10/18/2019   Lab Results  Component Value Date   ALT 25 10/18/2019   AST 18 10/18/2019   ALKPHOS 117 10/18/2019   BILITOT 0.3 10/18/2019   Lab Results  Component Value Date   HGBA1C 6.4 (H) 10/18/2019   HGBA1C 6.4 (H) 05/17/2019   HGBA1C 6.6 (H) 10/11/2018   Lab Results  Component Value Date   INSULIN 13.3 10/18/2019   INSULIN 11.5 10/11/2018   Lab Results  Component Value Date   TSH 1.880 10/11/2018   Lab Results  Component Value Date    CHOL 168 10/18/2019   HDL 46 10/18/2019   LDLCALC 109 (H) 10/18/2019   TRIG 65 10/18/2019   CHOLHDL 3.7 10/11/2018   Lab Results  Component Value Date   WBC 5.9 07/28/2018   HGB 14.0 07/28/2018   HCT 43.0 07/28/2018   MCV 88 07/28/2018   No results found for: IRON, TIBC, FERRITIN  Attestation Statements:   Reviewed by clinician on day of visit: allergies, medications, problem list, medical history, surgical history, family history, social history, and previous encounter notes.   Wilhemena Durie, am acting as Location manager for [employer], FNP-C.  I have reviewed the above documentation for accuracy and completeness, and I agree with the above. -  Georgianne Fick, FNP

## 2020-02-11 DIAGNOSIS — H401131 Primary open-angle glaucoma, bilateral, mild stage: Secondary | ICD-10-CM | POA: Diagnosis not present

## 2020-02-26 ENCOUNTER — Encounter (INDEPENDENT_AMBULATORY_CARE_PROVIDER_SITE_OTHER): Payer: Self-pay | Admitting: Family Medicine

## 2020-02-26 ENCOUNTER — Other Ambulatory Visit: Payer: Self-pay

## 2020-02-26 ENCOUNTER — Ambulatory Visit (INDEPENDENT_AMBULATORY_CARE_PROVIDER_SITE_OTHER): Payer: 59 | Admitting: Family Medicine

## 2020-02-26 VITALS — BP 102/68 | HR 52 | Temp 98.4°F | Ht 65.0 in | Wt 252.0 lb

## 2020-02-26 DIAGNOSIS — E1169 Type 2 diabetes mellitus with other specified complication: Secondary | ICD-10-CM

## 2020-02-26 DIAGNOSIS — I152 Hypertension secondary to endocrine disorders: Secondary | ICD-10-CM

## 2020-02-26 DIAGNOSIS — E559 Vitamin D deficiency, unspecified: Secondary | ICD-10-CM

## 2020-02-26 DIAGNOSIS — Z9189 Other specified personal risk factors, not elsewhere classified: Secondary | ICD-10-CM

## 2020-02-26 DIAGNOSIS — E785 Hyperlipidemia, unspecified: Secondary | ICD-10-CM | POA: Diagnosis not present

## 2020-02-26 DIAGNOSIS — I10 Essential (primary) hypertension: Secondary | ICD-10-CM

## 2020-02-26 DIAGNOSIS — Z6841 Body Mass Index (BMI) 40.0 and over, adult: Secondary | ICD-10-CM | POA: Diagnosis not present

## 2020-02-26 DIAGNOSIS — E1159 Type 2 diabetes mellitus with other circulatory complications: Secondary | ICD-10-CM

## 2020-02-26 MED ORDER — CHLORTHALIDONE 25 MG PO TABS: 25.0000 mg | ORAL_TABLET | Freq: Every day | ORAL | 0 refills | Status: DC

## 2020-02-26 MED ORDER — VITAMIN D (ERGOCALCIFEROL) 1.25 MG (50000 UNIT) PO CAPS: 50000.0000 [IU] | ORAL_CAPSULE | ORAL | 0 refills | Status: AC

## 2020-02-26 MED FILL — VIT D2 1.25 MG (50,000 UNIT: 1.25 MG | 84 days supply | Qty: 6 | Fill #0

## 2020-02-26 MED FILL — CHLORTHALIDONE 25 MG TABS: 25 | 30 days supply | Qty: 30 | Fill #0

## 2020-02-26 NOTE — Progress Notes (Signed)
Chief Complaint:   OBESITY Hannah Gibson is here to discuss her progress with her obesity treatment plan along with follow-up of her obesity related diagnoses. [name] is on following a lower carbohydrate, vegetable and lean protein rich diet plan and states she is following her eating plan approximately 50% of the time. [name] states she is doing 0 minutes 0 times per week.  Today's visit was #: 28 Starting weight: 281 lbs Starting date: 10/11/2018 Today's weight: 252 lbs Today's date: 02/26/2020 Total lbs lost to date: 29 Total lbs lost since last in-office visit: 1  Interim History: [name] has struggled over the holiday weekend with celebration eating and has had some carbohydrates. Overall she still likse the Low carbohydrate plan. She does skip meals at times.  Subjective:   1. Type 2 diabetes mellitus without complication, without long-term current use of insulin ([place]) [name]'s diabetes mellitus is well controlled on metformin. Last A1c was 6.4. She is on metformin only. She does not check CBGs.  Lab Results  Component Value Date   HGBA1C 6.4 (H) 10/18/2019   HGBA1C 6.4 (H) 05/17/2019   HGBA1C 6.6 (H) 10/11/2018   Lab Results  Component Value Date   LDLCALC 109 (H) 10/18/2019   CREATININE 1.05 (H) 10/18/2019   Lab Results  Component Value Date   INSULIN 13.3 10/18/2019   INSULIN 11.5 10/11/2018   2. Vitamin D deficiency Carina's last Vit D level was at goal. She is on prescription Vit D every 14 days.  3. Essential hypertension [name]'s blood pressure was very low today at 102/68. She denies dizziness.  BP Readings from Last 3 Encounters:  02/26/20 102/68  02/06/20 103/67  01/17/20 110/70   Lab Results  Component Value Date   CREATININE 1.05 (H) 10/18/2019   CREATININE 1.06 (H) 05/17/2019   CREATININE 0.86 10/11/2018   4. Other hyperlipidemia [name] has hyperlipidemia and has been trying to improve her cholesterol levels with intensive  lifestyle modification including a low saturated fat diet, exercise and weight loss. She is taking atorvastatin 20 mg. She denies myalgias, chest pain, or shortness of breath.  Lab Results  Component Value Date   ALT 25 10/18/2019   AST 18 10/18/2019   ALKPHOS 117 10/18/2019   BILITOT 0.3 10/18/2019   Lab Results  Component Value Date   CHOL 168 10/18/2019   HDL 46 10/18/2019   LDLCALC 109 (H) 10/18/2019   TRIG 65 10/18/2019   CHOLHDL 3.7 10/11/2018  The 10-year ASCVD risk score [name], et al., 2013) is: 5.8%   Values used to calculate the score:     Age: 46 years     Sex: Female     Is Non-Hispanic African American: Yes     Diabetic: Yes     Tobacco smoker: No     Systolic Blood Pressure: [id number] mmHg     Is BP treated: Yes     HDL Cholesterol: 46 mg/dL     Total Cholesterol: 168 mg/dL  5. At risk for complication associated with hypotension The patient is at a higher than average risk of hypotension due to weight loss and taking medication for HTN.  Assessment/Plan:   1. Type 2 diabetes mellitus without complication, without long-term current use of insulin ([place]) . [name] will continue taking metformin, and we will check labs today.  - Hemoglobin A1c - Insulin, random  2. Vitamin D deficiency Low Vitamin D level contributes to fatigue and are associated with obesity, breast, and colon cancer.  We will refill prescription Vitamin D for 90 days with no refills. We will check labs today. [name] will follow-up for routine testing of Vitamin D, at least 2-3 times per year to avoid over-replacement.  - VITAMIN D 25 Hydroxy (Vit-D Deficiency, Fractures)  - Vitamin D, Ergocalciferol, (DRISDOL) 1.25 MG (50000 UNIT) CAPS capsule; Take 1 capsule (50,000 Units total) by mouth every 14 (fourteen) days.  Dispense: 6 capsule; Refill: 0  3. Essential hypertension [name] is working on healthy weight loss and exercise to improve blood pressure control. We will watch for signs  of hypotension as she continues her lifestyle modifications. We will check labs today, and we will refill chlorthalidone for 1 month.  - Comprehensive metabolic panel  - chlorthalidone (HYGROTON) 25 MG tablet; Take 1 tablet (25 mg total) by mouth daily.  Dispense: 30 tablet; Refill: 0  4. Other hyperlipidemia Cardiovascular risk and specific lipid/LDL goals reviewed. We discussed several lifestyle modifications today. [name] will continue atorvastatin, and will continue to work on diet, exercise and weight loss efforts. We will check labs today. - Comprehensive metabolic panel - Lipid Panel With LDL/HDL Ratio  5. At risk for complication associated with hypotension [name] was given approximately 15 minutes of education and counseling today to help avoid hypotension. We discussed risks of hypotension with weight loss and signs of hypotension such as feeling lightheaded or unsteady.  Repetitive spaced learning was employed today to elicit superior memory formation and behavioral change.  6. Class 3 severe obesity with serious comorbidity and body mass index (BMI) of 40.0 to 44.9 in adult, unspecified obesity type ([name]) [name] is currently in the action stage of change. As such, her goal is to continue with weight loss efforts. She has agreed to following a lower carbohydrate, vegetable and lean protein rich diet plan.   Exercise goals: All adults should avoid inactivity. Some physical activity is better than none, and adults who participate in any amount of physical activity gain some health benefits.  Behavioral modification strategies: decreasing simple carbohydrates and no skipping meals.  [name] has agreed to follow-up with our clinic in 3 weeks. She was informed of the importance of frequent follow-up visits to maximize her success with intensive lifestyle modifications for her multiple health conditions.   [name] was informed we would discuss her lab results at her next  visit unless there is a critical issue that needs to be addressed sooner. [name] agreed to keep her next visit at the agreed upon time to discuss these results.  Objective:   Blood pressure 102/68, pulse (!) 52, temperature 98.4 F (36.9 C), temperature source Oral, height 5\' 5"  (1.651 m), weight 252 lb (114.3 kg), last menstrual period 12/09/1997, SpO2 100 %. Body mass index is 41.93 kg/m.  General: Cooperative, alert, well developed, in no acute distress. HEENT: Conjunctivae and lids unremarkable. Cardiovascular: Regular rhythm.  Lungs: Normal work of breathing. Neurologic: No focal deficits.   Lab Results  Component Value Date   CREATININE 1.05 (H) 10/18/2019   BUN 25 (H) 10/18/2019   NA 140 10/18/2019   K 4.1 10/18/2019   CL 101 10/18/2019   CO2 23 10/18/2019   Lab Results  Component Value Date   ALT 25 10/18/2019   AST 18 10/18/2019   ALKPHOS 117 10/18/2019   BILITOT 0.3 10/18/2019   Lab Results  Component Value Date   HGBA1C 6.4 (H) 10/18/2019   HGBA1C 6.4 (H) 05/17/2019   HGBA1C 6.6 (H) 10/11/2018   Lab Results  Component Value  Date   INSULIN 13.3 10/18/2019   INSULIN 11.5 10/11/2018   Lab Results  Component Value Date   TSH 1.880 10/11/2018   Lab Results  Component Value Date   CHOL 168 10/18/2019   HDL 46 10/18/2019   LDLCALC 109 (H) 10/18/2019   TRIG 65 10/18/2019   CHOLHDL 3.7 10/11/2018   Lab Results  Component Value Date   WBC 5.9 07/28/2018   HGB 14.0 07/28/2018   HCT 43.0 07/28/2018   MCV 88 07/28/2018   No results found for: IRON, TIBC, FERRITIN  Attestation Statements:   Reviewed by clinician on day of visit: allergies, medications, problem list, medical history, surgical history, family history, social history, and previous encounter notes.   Wilhemena Durie, am acting as Location manager for [employer], FNP-C.  I have reviewed the above documentation for accuracy and completeness, and I agree with the above. -  Georgianne Fick, FNP

## 2020-02-28 ENCOUNTER — Ambulatory Visit (INDEPENDENT_AMBULATORY_CARE_PROVIDER_SITE_OTHER): Payer: 59 | Admitting: Family Medicine

## 2020-02-29 MED FILL — TRAVOPROST (BAK FREE) 0.004: 0.004 | 25 days supply | Qty: 3 | Fill #2

## 2020-03-06 DIAGNOSIS — E119 Type 2 diabetes mellitus without complications: Secondary | ICD-10-CM | POA: Diagnosis not present

## 2020-03-06 DIAGNOSIS — E559 Vitamin D deficiency, unspecified: Secondary | ICD-10-CM | POA: Diagnosis not present

## 2020-03-06 DIAGNOSIS — E7849 Other hyperlipidemia: Secondary | ICD-10-CM | POA: Diagnosis not present

## 2020-03-06 DIAGNOSIS — I10 Essential (primary) hypertension: Secondary | ICD-10-CM | POA: Diagnosis not present

## 2020-03-07 LAB — COMPREHENSIVE METABOLIC PANEL
ALT: 22 IU/L (ref 0–32)
AST: 19 IU/L (ref 0–40)
Albumin/Globulin Ratio: 1.5 (ref 1.2–2.2)
Albumin: 4.1 g/dL (ref 3.8–4.9)
Alkaline Phosphatase: 98 IU/L (ref 48–121)
BUN/Creatinine Ratio: 20 (ref 9–23)
BUN: 20 mg/dL (ref 6–24)
Bilirubin Total: 0.2 mg/dL (ref 0.0–1.2)
CO2: 24 mmol/L (ref 20–29)
Calcium: 9.8 mg/dL (ref 8.7–10.2)
Chloride: 101 mmol/L (ref 96–106)
Creatinine, Ser: 1.01 mg/dL — ABNORMAL HIGH (ref 0.57–1.00)
GFR calc Af Amer: 72 mL/min/{1.73_m2} (ref >59)
GFR calc non Af Amer: 62 mL/min/{1.73_m2} (ref >59)
Globulin, Total: 2.8 g/dL (ref 1.5–4.5)
Glucose: 100 mg/dL — ABNORMAL HIGH (ref 65–99)
Potassium: 4.6 mmol/L (ref 3.5–5.2)
Sodium: 140 mmol/L (ref 134–144)
Total Protein: 6.9 g/dL (ref 6.0–8.5)

## 2020-03-07 LAB — LIPID PANEL WITH LDL/HDL RATIO
Cholesterol, Total: 157 mg/dL (ref 100–199)
HDL: 44 mg/dL (ref >39)
LDL Chol Calc (NIH): 100 mg/dL — ABNORMAL HIGH (ref 0–99)
LDL/HDL Ratio: 2.3 ratio (ref 0.0–3.2)
Triglycerides: 66 mg/dL (ref 0–149)
VLDL Cholesterol Cal: 13 mg/dL (ref 5–40)

## 2020-03-07 LAB — VITAMIN D 25 HYDROXY (VIT D DEFICIENCY, FRACTURES): Vit D, 25-Hydroxy: 58.2 ng/mL (ref 30.0–100.0)

## 2020-03-07 LAB — HEMOGLOBIN A1C
Est. average glucose Bld gHb Est-mCnc: 137 mg/dL
Hgb A1c MFr Bld: 6.4 % — ABNORMAL HIGH (ref 4.8–5.6)

## 2020-03-07 LAB — INSULIN, RANDOM: INSULIN: 11.3 u[IU]/mL (ref 2.6–24.9)

## 2020-03-18 ENCOUNTER — Ambulatory Visit (INDEPENDENT_AMBULATORY_CARE_PROVIDER_SITE_OTHER): Payer: 59 | Admitting: Family Medicine

## 2020-03-18 ENCOUNTER — Encounter (INDEPENDENT_AMBULATORY_CARE_PROVIDER_SITE_OTHER): Payer: Self-pay | Admitting: Family Medicine

## 2020-03-18 ENCOUNTER — Other Ambulatory Visit: Payer: Self-pay

## 2020-03-18 VITALS — BP 112/74 | HR 65 | Temp 98.2°F | Ht 65.0 in | Wt 247.0 lb

## 2020-03-18 DIAGNOSIS — E7849 Other hyperlipidemia: Secondary | ICD-10-CM

## 2020-03-18 DIAGNOSIS — Z6841 Body Mass Index (BMI) 40.0 and over, adult: Secondary | ICD-10-CM | POA: Diagnosis not present

## 2020-03-18 DIAGNOSIS — E1159 Type 2 diabetes mellitus with other circulatory complications: Secondary | ICD-10-CM | POA: Diagnosis not present

## 2020-03-18 DIAGNOSIS — I10 Essential (primary) hypertension: Secondary | ICD-10-CM

## 2020-03-18 DIAGNOSIS — Z9189 Other specified personal risk factors, not elsewhere classified: Secondary | ICD-10-CM | POA: Diagnosis not present

## 2020-03-18 DIAGNOSIS — I152 Hypertension secondary to endocrine disorders: Secondary | ICD-10-CM

## 2020-03-18 DIAGNOSIS — B0052 Herpesviral keratitis: Secondary | ICD-10-CM | POA: Diagnosis not present

## 2020-03-18 DIAGNOSIS — E1169 Type 2 diabetes mellitus with other specified complication: Secondary | ICD-10-CM

## 2020-03-18 MED ORDER — METFORMIN HCL 500 MG PO TABS: 500.0000 mg | ORAL_TABLET | Freq: Every day | ORAL | 0 refills | Status: DC

## 2020-03-18 MED ORDER — CHLORTHALIDONE 25 MG PO TABS: 25.0000 mg | ORAL_TABLET | Freq: Every day | ORAL | 0 refills | Status: DC

## 2020-03-18 MED ORDER — ATORVASTATIN CALCIUM 20 MG PO TABS: 20.0000 mg | ORAL_TABLET | Freq: Every day | ORAL | 0 refills | Status: DC

## 2020-03-18 MED FILL — VALACYCLOVIR HCL 500 MG TAB: 500 | 14 days supply | Qty: 28 | Fill #0

## 2020-03-18 MED FILL — METFORMIN HCL 500 MG TABS: 500 | 30 days supply | Qty: 30 | Fill #0

## 2020-03-18 MED FILL — ATROPINE 1% EYE DROPS: 1 | 40 days supply | Qty: 2 | Fill #0

## 2020-03-18 MED FILL — ATORVASTATIN 20 MG TABLET: 20 | 30 days supply | Qty: 30 | Fill #0

## 2020-03-20 DIAGNOSIS — B0052 Herpesviral keratitis: Secondary | ICD-10-CM | POA: Diagnosis not present

## 2020-03-20 MED FILL — LOTEPREDNOL ETABONATE 0.5 %: 0.5 | 7 days supply | Qty: 5 | Fill #0

## 2020-03-20 NOTE — Progress Notes (Signed)
Chief Complaint:   OBESITY Hannah Gibson is here to discuss her progress with her obesity treatment plan along with follow-up of her obesity related diagnoses. Hannah Gibson is on following a lower carbohydrate, vegetable and lean protein rich diet plan and states she is following her eating plan approximately 75% of the time. Hannah Gibson states she is on the treadmill and riding the stationary bike for 35 minutes 3 times per week.  Today's visit was #: 78 Starting weight: 281 lbs Starting date: 10/11/2018 Today's weight: 247 lbs Today's date: 03/18/2020 Total lbs lost to date: 34 Total lbs lost since last in-office visit: 5  Interim History: Hannah Gibson is doing very well on the plan and she has lost 34 lbs overall. She is happy with the Low carbohydrate plan. She is increasing her exercise. She is not skipping meals and eating adequate vegetables.  Subjective:   1. Type 2 diabetes mellitus without complication, without long-term current use of insulin ([place]) Hannah Gibson's diabetes mellitus is well controlled on metformin only. She does not check her sugars at home. I discussed labs with the patient today.  Lab Results  Component Value Date   HGBA1C 6.4 (H) 03/06/2020   HGBA1C 6.4 (H) 10/18/2019   HGBA1C 6.4 (H) 05/17/2019   Lab Results  Component Value Date   LDLCALC 100 (H) 03/06/2020   CREATININE 1.01 (H) 03/06/2020   Lab Results  Component Value Date   INSULIN 11.3 03/06/2020   INSULIN 13.3 10/18/2019   INSULIN 11.5 10/11/2018   2. Essential hypertension Hannah Gibson's blood pressure is well controlled on chlorthalidone. Cardiovascular ROS: no chest pain or dyspnea on exertion.  BP Readings from Last 3 Encounters:  03/18/20 112/74  02/26/20 102/68  02/06/20 103/67   Lab Results  Component Value Date   CREATININE 1.01 (H) 03/06/2020   CREATININE 1.05 (H) 10/18/2019   CREATININE 1.06 (H) 05/17/2019   3. Other hyperlipidemia Hannah Gibson has hyperlipidemia and has been trying  to improve her cholesterol levels with intensive lifestyle modification including a low saturated fat diet, exercise and weight loss. Her LDL has improved and is now at 100. HDL is low at 44 and triglycerides are within normal limits. She is on Lipitor 20 mg. She denies any chest pain, claudication or myalgias. I discussed labs with the patient today.  Lab Results  Component Value Date   ALT 22 03/06/2020   AST 19 03/06/2020   ALKPHOS 98 03/06/2020   BILITOT 0.2 03/06/2020   Lab Results  Component Value Date   CHOL 157 03/06/2020   HDL 44 03/06/2020   LDLCALC 100 (H) 03/06/2020   TRIG 66 03/06/2020   CHOLHDL 3.7 10/11/2018   4. At risk for heart disease Hannah Gibson is at a higher than average risk for cardiovascular disease due to obesity, HLD,  And HTN.   Assessment/Plan:   1. Type 2 diabetes mellitus without complication, without long-term current use of insulin (HCC) Good blood sugar control is important to decrease the likelihood of diabetic complications such as nephropathy, neuropathy, limb loss, blindness, coronary artery disease, and death. Intensive lifestyle modification including diet, exercise and weight loss are the first line of treatment for diabetes. Hannah Gibson will continue metformin and we will refill for 1 month.  - metFORMIN (GLUCOPHAGE) 500 MG tablet; Take 1 tablet (500 mg total) by mouth daily with supper.  Dispense: 30 tablet; Refill: 0  2. Essential hypertension Hannah Gibson is working on healthy weight loss and exercise to improve blood pressure control. We will watch for  signs of hypotension as she continues her lifestyle modifications. We will refill chlorthalidone for 1 month.  - chlorthalidone (HYGROTON) 25 MG tablet; Take 1 tablet (25 mg total) by mouth daily.  Dispense: 30 tablet; Refill: 0  3. Other hyperlipidemia Cardiovascular risk and specific lipid/LDL goals reviewed. We discussed several lifestyle modifications today and Hannah Gibson will continue to work  on diet, exercise and weight loss efforts. We will refill Lipitor for 1 month. Orders and follow up as documented in patient record.   Counseling Intensive lifestyle modifications are the first line treatment for this issue. . Dietary changes: Increase soluble fiber. Decrease simple carbohydrates. . Exercise changes: Moderate to vigorous-intensity aerobic activity 150 minutes per week if tolerated. . Lipid-lowering medications: see documented in medical record.  - atorvastatin (LIPITOR) 20 MG tablet; Take 1 tablet (20 mg total) by mouth daily.  Dispense: 30 tablet; Refill: 0  4. At risk for heart disease Hannah Gibson was given approximately 15 minutes of coronary artery disease prevention counseling today. She is 59 y.o. female and has risk factors for heart disease including obesity. We discussed intensive lifestyle modifications today with an emphasis on specific weight loss instructions and strategies.   Repetitive spaced learning was employed today to elicit superior memory formation and behavioral change.  5. Class 3 severe obesity with serious comorbidity and body mass index (BMI) of 40.0 to 44.9 in adult, unspecified obesity type ([place]) Hannah Gibson is currently in the action stage of change. As such, her goal is to continue with weight loss efforts. She has agreed to following a lower carbohydrate, vegetable and lean protein rich diet plan.   Exercise goals: As is.  Behavioral modification strategies: decreasing simple carbohydrates and meal planning and cooking strategies.  Hannah Gibson has agreed to follow-up with our clinic in 3 weeks. She was informed of the importance of frequent follow-up visits to maximize her success with intensive lifestyle modifications for her multiple health conditions.   Objective:   Blood pressure 112/74, pulse 65, temperature 98.2 F (36.8 C), temperature source Oral, height 5\' 5"  (1.651 m), weight 247 lb (112 kg), last menstrual period 12/09/1997, SpO2 98  %. Body mass index is 41.1 kg/m.  General: Cooperative, alert, well developed, in no acute distress. HEENT: Conjunctivae and lids unremarkable. Cardiovascular: Regular rhythm.  Lungs: Normal work of breathing. Neurologic: No focal deficits.   Lab Results  Component Value Date   CREATININE 1.01 (H) 03/06/2020   BUN 20 03/06/2020   NA 140 03/06/2020   K 4.6 03/06/2020   CL 101 03/06/2020   CO2 24 03/06/2020   Lab Results  Component Value Date   ALT 22 03/06/2020   AST 19 03/06/2020   ALKPHOS 98 03/06/2020   BILITOT 0.2 03/06/2020   Lab Results  Component Value Date   HGBA1C 6.4 (H) 03/06/2020   HGBA1C 6.4 (H) 10/18/2019   HGBA1C 6.4 (H) 05/17/2019   HGBA1C 6.6 (H) 10/11/2018   Lab Results  Component Value Date   INSULIN 11.3 03/06/2020   INSULIN 13.3 10/18/2019   INSULIN 11.5 10/11/2018   Lab Results  Component Value Date   TSH 1.880 10/11/2018   Lab Results  Component Value Date   CHOL 157 03/06/2020   HDL 44 03/06/2020   LDLCALC 100 (H) 03/06/2020   TRIG 66 03/06/2020   CHOLHDL 3.7 10/11/2018   Lab Results  Component Value Date   WBC 5.9 07/28/2018   HGB 14.0 07/28/2018   HCT 43.0 07/28/2018   MCV 88 07/28/2018  No results found for: IRON, TIBC, FERRITIN  Attestation Statements:   Reviewed by clinician on day of visit: allergies, medications, problem list, medical history, surgical history, family history, social history, and previous encounter notes.   Wilhemena Durie, am acting as Location manager for [employer], FNP-C.  I have reviewed the above documentation for accuracy and completeness, and I agree with the above. -  Georgianne Fick, FNP

## 2020-03-21 MED FILL — CHLORTHALIDONE 25 MG TABS: 25 | 30 days supply | Qty: 30 | Fill #0

## 2020-03-24 ENCOUNTER — Encounter (INDEPENDENT_AMBULATORY_CARE_PROVIDER_SITE_OTHER): Payer: Self-pay | Admitting: Family Medicine

## 2020-03-25 DIAGNOSIS — B0052 Herpesviral keratitis: Secondary | ICD-10-CM | POA: Diagnosis not present

## 2020-04-03 DIAGNOSIS — B0052 Herpesviral keratitis: Secondary | ICD-10-CM | POA: Diagnosis not present

## 2020-04-08 ENCOUNTER — Ambulatory Visit (INDEPENDENT_AMBULATORY_CARE_PROVIDER_SITE_OTHER): Payer: 59 | Admitting: Family Medicine

## 2020-04-08 ENCOUNTER — Encounter (INDEPENDENT_AMBULATORY_CARE_PROVIDER_SITE_OTHER): Payer: Self-pay | Admitting: Family Medicine

## 2020-04-08 ENCOUNTER — Other Ambulatory Visit: Payer: Self-pay

## 2020-04-08 VITALS — BP 107/70 | HR 59 | Temp 97.7°F | Ht 65.0 in | Wt 251.0 lb

## 2020-04-08 DIAGNOSIS — E1169 Type 2 diabetes mellitus with other specified complication: Secondary | ICD-10-CM | POA: Diagnosis not present

## 2020-04-08 DIAGNOSIS — E785 Hyperlipidemia, unspecified: Secondary | ICD-10-CM

## 2020-04-08 DIAGNOSIS — Z9189 Other specified personal risk factors, not elsewhere classified: Secondary | ICD-10-CM

## 2020-04-08 DIAGNOSIS — E66813 Obesity, class 3: Secondary | ICD-10-CM

## 2020-04-08 DIAGNOSIS — Z6841 Body Mass Index (BMI) 40.0 and over, adult: Secondary | ICD-10-CM

## 2020-04-08 MED ORDER — METFORMIN HCL 500 MG PO TABS: 500.0000 mg | ORAL_TABLET | Freq: Every day | ORAL | 0 refills | Status: DC

## 2020-04-08 MED ORDER — ATORVASTATIN CALCIUM 20 MG PO TABS: 20.0000 mg | ORAL_TABLET | Freq: Every day | ORAL | 0 refills | Status: DC

## 2020-04-08 MED FILL — TRAVOPROST (BAK FREE) 0.004: 0.004 | 25 days supply | Qty: 3 | Fill #3

## 2020-04-10 NOTE — Progress Notes (Signed)
Chief Complaint:   OBESITY Hannah Gibson is here to discuss her progress with her obesity treatment plan along with follow-up of her obesity related diagnoses. Hannah Gibson is on following a lower carbohydrate, vegetable and lean protein rich diet plan and states she is following her eating plan approximately 65% of the time. Hannah Gibson states she is doing 0 minutes 0 times per week.  Today's visit was #: 81 Starting weight: 281 lbs Starting date: 10/11/2018 Today's weight: 251 lbs Today's date: 04/08/2020 Total lbs lost to date: 30 Total lbs lost since last in-office visit: 0  Interim History: Hannah Gibson notes eating at celebrations recently, and she has had some sweets and extra calories. She is up 4 lbs today. She does miss eating fruit.  Subjective:   1. Hyperlipidemia associated with type 2 diabetes mellitus ([place]) Hannah Gibson's last LDL was at 100, HDL low at 44, and triglycerides at goal at 66. She is on 20 mg of atorvastatin daily.   Lab Results  Component Value Date   CHOL 157 03/06/2020   HDL 44 03/06/2020   LDLCALC 100 (H) 03/06/2020   TRIG 66 03/06/2020   CHOLHDL 3.7 10/11/2018   Lab Results  Component Value Date   ALT 22 03/06/2020   AST 19 03/06/2020   ALKPHOS 98 03/06/2020   BILITOT 0.2 03/06/2020   The 10-year ASCVD risk score Hannah Bussing DC Jr., Hannah al., Hannah Gibson) is: 6.6%   Values used to calculate the score:     Age: 59 years     Sex: Female     Is Non-Hispanic African American: Yes     Diabetic: Yes     Tobacco smoker: No     Systolic Blood Pressure: 606 mmHg     Is BP treated: Yes     HDL Cholesterol: 44 mg/dL     Total Cholesterol: 157 mg/dL  2. Type 2 diabetes mellitus with other specified complication, without long-term current use of insulin (Hannah Gibson) Hannah Gibson's diabetes mellitus is well controlled on metformin only. She does not check her CBGs at home.  Lab Results  Component Value Date   HGBA1C 6.4 (H) 03/06/2020   HGBA1C 6.4 (H) 10/18/2019   HGBA1C 6.4  (H) 05/17/2019   Lab Results  Component Value Date   LDLCALC 100 (H) 03/06/2020   CREATININE 1.01 (H) 03/06/2020   Lab Results  Component Value Date   INSULIN 11.3 03/06/2020   INSULIN 13.3 10/18/2019   INSULIN 11.5 10/11/2018   3. At risk for heart disease Hannah Gibson is at a higher than average risk for cardiovascular disease due to obesity, diabetes mellitus, and hyperlipidemia.   Assessment/Plan:   1. Hyperlipidemia associated with type 2 diabetes mellitus ([place]) Cardiovascular risk and specific lipid/LDL goals reviewed. We discussed several lifestyle modifications today and Sagrario will continue to work on diet, increasing exercise and weight loss efforts. We will refill atorvastatin for 1 month.   - atorvastatin (LIPITOR) 20 MG tablet; Take 1 tablet (20 mg total) by mouth daily.  Dispense: 30 tablet; Refill: 0  2. Type 2 diabetes mellitus with other specified complication, without long-term current use of insulin (HCC) Good blood sugar control is important to decrease the likelihood of diabetic complications such as nephropathy, neuropathy, limb loss, blindness, coronary artery disease, and death. Intensive lifestyle modification including diet, exercise and weight loss are the first line of treatment for diabetes. Hannah Gibson will continue metformin and we will refill for 1 month.  - metFORMIN (GLUCOPHAGE) 500 MG tablet; Take 1 tablet (500  mg total) by mouth daily with supper.  Dispense: 30 tablet; Refill: 0  3. At risk for heart disease Hannah Gibson was given approximately 15 minutes of coronary artery disease prevention counseling today. She is 59 y.o. female and has risk factors for heart disease including obesity. We discussed intensive lifestyle modifications today with an emphasis on specific weight loss instructions and strategies.   Repetitive spaced learning was employed today to elicit superior memory formation and behavioral change.  4. Class 3 severe obesity with serious  comorbidity and body mass index (BMI) of 40.0 to 44.9 in adult, unspecified obesity type ([place]) Hannah Gibson is currently in the action stage of change. As such, her goal is to continue with weight loss efforts. She has agreed to following a lower carbohydrate, vegetable and lean protein rich diet plan.   Exercise goals: All adults should avoid inactivity. Some physical activity is better than none, and adults who participate in any amount of physical activity gain some health benefits.  Behavioral modification strategies: decreasing simple carbohydrates and planning for success.  Hannah Gibson has agreed to follow-up with our clinic in 3 weeks. She was informed of the importance of frequent follow-up visits to maximize her success with intensive lifestyle modifications for her multiple health conditions.   Objective:   Blood pressure 107/70, pulse (!) 59, temperature 97.7 F (36.5 C), temperature source Oral, height 5\' 5"  (1.651 m), weight 251 lb (113.9 kg), last menstrual period 12/09/1997, SpO2 98 %. Body mass index is 41.77 kg/m.  General: Cooperative, alert, well developed, in no acute distress. HEENT: Conjunctivae and lids unremarkable. Cardiovascular: Regular rhythm.  Lungs: Normal work of breathing. Neurologic: No focal deficits.   Lab Results  Component Value Date   CREATININE 1.01 (H) 03/06/2020   BUN 20 03/06/2020   NA 140 03/06/2020   K 4.6 03/06/2020   CL 101 03/06/2020   CO2 24 03/06/2020   Lab Results  Component Value Date   ALT 22 03/06/2020   AST 19 03/06/2020   ALKPHOS 98 03/06/2020   BILITOT 0.2 03/06/2020   Lab Results  Component Value Date   HGBA1C 6.4 (H) 03/06/2020   HGBA1C 6.4 (H) 10/18/2019   HGBA1C 6.4 (H) 05/17/2019   HGBA1C 6.6 (H) 10/11/2018   Lab Results  Component Value Date   INSULIN 11.3 03/06/2020   INSULIN 13.3 10/18/2019   INSULIN 11.5 10/11/2018   Lab Results  Component Value Date   TSH 1.880 10/11/2018   Lab Results  Component  Value Date   CHOL 157 03/06/2020   HDL 44 03/06/2020   LDLCALC 100 (H) 03/06/2020   TRIG 66 03/06/2020   CHOLHDL 3.7 10/11/2018   Lab Results  Component Value Date   WBC 5.9 07/28/2018   HGB 14.0 07/28/2018   HCT 43.0 07/28/2018   MCV 88 07/28/2018   No results found for: IRON, TIBC, FERRITIN  Attestation Statements:   Reviewed by clinician on day of visit: allergies, medications, problem list, medical history, surgical history, family history, social history, and previous encounter notes.   Wilhemena Durie, am acting as Location manager for [employer], FNP-C.  I have reviewed the above documentation for accuracy and completeness, and I agree with the above. -  Georgianne Fick, FNP

## 2020-04-11 ENCOUNTER — Encounter (INDEPENDENT_AMBULATORY_CARE_PROVIDER_SITE_OTHER): Payer: Self-pay | Admitting: Family Medicine

## 2020-04-11 DIAGNOSIS — E1169 Type 2 diabetes mellitus with other specified complication: Secondary | ICD-10-CM | POA: Insufficient documentation

## 2020-04-14 MED FILL — MONTELUKAST SOD 10 MG TAB: 10 | 30 days supply | Qty: 30 | Fill #4

## 2020-04-24 MED FILL — METFORMIN HCL 500 MG TABS: 500 | 30 days supply | Qty: 30 | Fill #0

## 2020-04-29 ENCOUNTER — Ambulatory Visit (INDEPENDENT_AMBULATORY_CARE_PROVIDER_SITE_OTHER): Payer: 59 | Admitting: Family Medicine

## 2020-04-29 ENCOUNTER — Other Ambulatory Visit: Payer: Self-pay

## 2020-04-29 ENCOUNTER — Encounter (INDEPENDENT_AMBULATORY_CARE_PROVIDER_SITE_OTHER): Payer: Self-pay | Admitting: Family Medicine

## 2020-04-29 VITALS — BP 124/73 | HR 59 | Temp 97.9°F | Ht 65.0 in | Wt 252.0 lb

## 2020-04-29 DIAGNOSIS — Z6841 Body Mass Index (BMI) 40.0 and over, adult: Secondary | ICD-10-CM | POA: Diagnosis not present

## 2020-04-29 DIAGNOSIS — E1169 Type 2 diabetes mellitus with other specified complication: Secondary | ICD-10-CM | POA: Diagnosis not present

## 2020-04-29 NOTE — Progress Notes (Signed)
Chief Complaint:   OBESITY Hannah Gibson is here to discuss her progress with her obesity treatment plan along with follow-up of her obesity related diagnoses. Hannah Gibson is on following a lower carbohydrate, vegetable and lean protein rich diet plan and states she is following her eating plan approximately 49% of the time. Hannah Gibson states she is doing 0 minutes 0 times per week.  Today's visit was #: 73 Starting weight: 281 lbs Starting date: 10/11/2018 Today's weight: 252 lbs Today's date: 04/29/2020 Total lbs lost to date: 29 Total lbs lost since last in-office visit: 0  Interim History: Hannah Gibson notes cravings for french fries. She notes she has been eating extra carbohydrates for the past few weeks. She is up 1 lb today.  Subjective:   1. Type 2 diabetes mellitus with other specified complication, without long-term current use of insulin ([place]) Hannah Gibson's diabetes mellitus is well controlled on metformin 500 mg daily. She does not check her CBGs at home.  Lab Results  Component Value Date   HGBA1C 6.4 (H) 03/06/2020   HGBA1C 6.4 (H) 10/18/2019   HGBA1C 6.4 (H) 05/17/2019   Lab Results  Component Value Date   LDLCALC 100 (H) 03/06/2020   CREATININE 1.01 (H) 03/06/2020   Lab Results  Component Value Date   INSULIN 11.3 03/06/2020   INSULIN 13.3 10/18/2019   INSULIN 11.5 10/11/2018   Assessment/Plan:   1. Type 2 diabetes mellitus with other specified complication, without long-term current use of insulin ([place])  Hannah Gibson will continue metformin, and will follow up as directed.  2. Class 3 severe obesity with serious comorbidity and body mass index (BMI) of 40.0 to 44.9 in adult, unspecified obesity type ([facility]) Hannah Gibson is currently in the action stage of change. As such, her goal is to continue with weight loss efforts. She has agreed to the Category 2 Plan.   Additional breakfast and lunch options.  Exercise goals: Treadmill for 10-15 minutes 3 times per week.   Behavioral modification strategies: increasing lean protein intake and decreasing simple carbohydrates.  Hannah Gibson has agreed to follow-up with our clinic in 3 weeks. She was informed of the importance of frequent follow-up visits to maximize her success with intensive lifestyle modifications for her multiple health conditions.   Objective:   Blood pressure 124/73, pulse (!) 59, temperature 97.9 F (36.6 C), temperature source Oral, height 5\' 5"  (1.651 m), weight 252 lb (114.3 kg), last menstrual period 12/09/1997, SpO2 100 %. Body mass index is 41.93 kg/m.  General: Cooperative, alert, well developed, in no acute distress. HEENT: Conjunctivae and lids unremarkable. Cardiovascular: Regular rhythm.  Lungs: Normal work of breathing. Neurologic: No focal deficits.   Lab Results  Component Value Date   CREATININE 1.01 (H) 03/06/2020   BUN 20 03/06/2020   NA 140 03/06/2020   K 4.6 03/06/2020   CL 101 03/06/2020   CO2 24 03/06/2020   Lab Results  Component Value Date   ALT 22 03/06/2020   AST 19 03/06/2020   ALKPHOS 98 03/06/2020   BILITOT 0.2 03/06/2020   Lab Results  Component Value Date   HGBA1C 6.4 (H) 03/06/2020   HGBA1C 6.4 (H) 10/18/2019   HGBA1C 6.4 (H) 05/17/2019   HGBA1C 6.6 (H) 10/11/2018   Lab Results  Component Value Date   INSULIN 11.3 03/06/2020   INSULIN 13.3 10/18/2019   INSULIN 11.5 10/11/2018   Lab Results  Component Value Date   TSH 1.880 10/11/2018   Lab Results  Component Value Date  CHOL 157 03/06/2020   HDL 44 03/06/2020   LDLCALC 100 (H) 03/06/2020   TRIG 66 03/06/2020   CHOLHDL 3.7 10/11/2018   Lab Results  Component Value Date   WBC 5.9 07/28/2018   HGB 14.0 07/28/2018   HCT 43.0 07/28/2018   MCV 88 07/28/2018   No results found for: IRON, TIBC, FERRITIN  Attestation Statements:   Reviewed by clinician on day of visit: allergies, medications, problem list, medical history, surgical history, family history, social history, and  previous encounter notes.   Wilhemena Durie, am acting as Location manager for [employer], FNP-C.  I have reviewed the above documentation for accuracy and completeness, and I agree with the above. -  Georgianne Fick, FNP

## 2020-05-09 ENCOUNTER — Other Ambulatory Visit (INDEPENDENT_AMBULATORY_CARE_PROVIDER_SITE_OTHER): Payer: Self-pay | Admitting: Family Medicine

## 2020-05-09 DIAGNOSIS — E1159 Type 2 diabetes mellitus with other circulatory complications: Secondary | ICD-10-CM

## 2020-05-09 DIAGNOSIS — E1169 Type 2 diabetes mellitus with other specified complication: Secondary | ICD-10-CM

## 2020-05-09 DIAGNOSIS — I152 Hypertension secondary to endocrine disorders: Secondary | ICD-10-CM

## 2020-05-09 DIAGNOSIS — E785 Hyperlipidemia, unspecified: Secondary | ICD-10-CM

## 2020-05-13 DIAGNOSIS — H401131 Primary open-angle glaucoma, bilateral, mild stage: Secondary | ICD-10-CM | POA: Diagnosis not present

## 2020-05-13 MED ORDER — ATORVASTATIN CALCIUM 20 MG PO TABS: 20.0000 mg | ORAL_TABLET | Freq: Every day | ORAL | 0 refills | Status: AC

## 2020-05-13 MED ORDER — CHLORTHALIDONE 25 MG PO TABS: 25.0000 mg | ORAL_TABLET | Freq: Every day | ORAL | 0 refills | Status: AC

## 2020-05-13 MED FILL — CHLORTHALIDONE 25 MG TABS: 25 | 30 days supply | Qty: 30 | Fill #0

## 2020-05-13 MED FILL — ATORVASTATIN CALCIUM 20 MG: 20 | 30 days supply | Qty: 30 | Fill #0

## 2020-05-19 MED FILL — TRAVOPROST (BAK FREE) 0.004: 0.004 | 25 days supply | Qty: 3 | Fill #0

## 2020-05-21 ENCOUNTER — Ambulatory Visit (INDEPENDENT_AMBULATORY_CARE_PROVIDER_SITE_OTHER): Payer: 59 | Admitting: Family Medicine

## 2020-05-28 ENCOUNTER — Other Ambulatory Visit: Payer: Self-pay

## 2020-05-28 ENCOUNTER — Ambulatory Visit (INDEPENDENT_AMBULATORY_CARE_PROVIDER_SITE_OTHER): Payer: 59 | Admitting: Family Medicine

## 2020-05-28 ENCOUNTER — Encounter (INDEPENDENT_AMBULATORY_CARE_PROVIDER_SITE_OTHER): Payer: Self-pay | Admitting: Family Medicine

## 2020-05-28 VITALS — BP 108/74 | Ht 65.0 in | Wt 254.0 lb

## 2020-05-28 DIAGNOSIS — Z6841 Body Mass Index (BMI) 40.0 and over, adult: Secondary | ICD-10-CM | POA: Diagnosis not present

## 2020-05-28 DIAGNOSIS — E1169 Type 2 diabetes mellitus with other specified complication: Secondary | ICD-10-CM | POA: Diagnosis not present

## 2020-05-28 DIAGNOSIS — E1159 Type 2 diabetes mellitus with other circulatory complications: Secondary | ICD-10-CM

## 2020-05-28 DIAGNOSIS — I10 Essential (primary) hypertension: Secondary | ICD-10-CM

## 2020-05-28 DIAGNOSIS — I152 Hypertension secondary to endocrine disorders: Secondary | ICD-10-CM

## 2020-05-28 MED ORDER — METFORMIN HCL 500 MG PO TABS: 500.0000 mg | ORAL_TABLET | Freq: Every day | ORAL | 0 refills | Status: AC

## 2020-05-28 MED FILL — METFORMIN HCL 500 MG TABS: 500 | 30 days supply | Qty: 30 | Fill #0

## 2020-05-29 ENCOUNTER — Encounter (INDEPENDENT_AMBULATORY_CARE_PROVIDER_SITE_OTHER): Payer: Self-pay | Admitting: Family Medicine

## 2020-05-29 NOTE — Progress Notes (Signed)
Chief Complaint:   OBESITY Hannah Gibson is here to discuss her progress with her obesity treatment plan along with follow-up of her obesity related diagnoses. [name] is on the Category 2 Plan and states she is following her eating plan approximately 75% of the time. [name] states she is walking for 15 minutes 3 times per week.  Today's visit was #: 65 Starting weight: 281 lbs Starting date: 10/11/2018 Today's weight: 254 lbs Today's date: 05/28/2020 Total lbs lost to date: 27 Total lbs lost since last in-office visit: 0  Interim History: Hannah Gibson was switched from Low carb to Category 3 at her last office visit, but she has not been able to adhere well to the plan. She feels lack of motivation to stick to the plan. She denies hunger or cravings.  Subjective:   1. Type 2 diabetes mellitus with other specified complication, without long-term current use of insulin (Hannah Gibson) Hannah Gibson's diabetes mellitus is well controlled on metformin 500 mg every AM. She denies hypoglycemia.  Lab Results  Component Value Date   HGBA1C 6.4 (H) 03/06/2020   HGBA1C 6.4 (H) 10/18/2019   HGBA1C 6.4 (H) 05/17/2019   Lab Results  Component Value Date   LDLCALC 100 (H) 03/06/2020   CREATININE 1.01 (H) 03/06/2020   Lab Results  Component Value Date   INSULIN 11.3 03/06/2020   INSULIN 13.3 10/18/2019   INSULIN 11.5 10/11/2018   2. Hypertension associated with type 2 diabetes mellitus ([place]) [name]'s blood pressure is well controlled on chlorthalidone. Cardiovascular ROS: no chest pain or dyspnea on exertion.  BP Readings from Last 3 Encounters:  04/29/20 124/73  04/08/20 107/70  03/18/20 112/74   Lab Results  Component Value Date   CREATININE 1.01 (H) 03/06/2020   CREATININE 1.05 (H) 10/18/2019   CREATININE 1.06 (H) 05/17/2019   Assessment/Plan:   1. Type 2 diabetes mellitus with other specified complication, without long-term current use of insulin ([place])  Hannah Gibson will continue her  medications, and we will refill metformin for 1 month.  - metFORMIN (GLUCOPHAGE) 500 MG tablet; Take 1 tablet (500 mg total) by mouth daily with breakfast.  Dispense: 30 tablet; Refill: 0  2. Hypertension associated with type 2 diabetes mellitus ([place]) Genell will continue chlorthalidone, and will continue working on healthy weight loss and exercise to improve blood pressure control. We will watch for signs of hypotension as she continues her lifestyle modifications.  3. Class 3 severe obesity with serious comorbidity and body mass index (BMI) of 40.0 to 44.9 in adult, unspecified obesity type ([place]) Hannah Gibson is currently in the action stage of change. As such, her goal is to continue with weight loss efforts. She has agreed to the Category 3 Plan.   Exercise goals: As is.  Behavioral modification strategies: increasing lean protein intake and decreasing simple carbohydrates.  Hannah Gibson has agreed to follow-up with our clinic in 3 weeks.  Objective:   Height 5\' 5"  (1.651 m), weight 254 lb (115.2 kg), last menstrual period 12/09/1997. Body mass index is 42.27 kg/m.  General: Cooperative, alert, well developed, in no acute distress. HEENT: Conjunctivae and lids unremarkable. Cardiovascular: Regular rhythm.  Lungs: Normal work of breathing. Neurologic: No focal deficits.   Lab Results  Component Value Date   CREATININE 1.01 (H) 03/06/2020   BUN 20 03/06/2020   NA 140 03/06/2020   K 4.6 03/06/2020   CL 101 03/06/2020   CO2 24 03/06/2020   Lab Results  Component Value Date   ALT 22 03/06/2020  AST 19 03/06/2020   ALKPHOS 98 03/06/2020   BILITOT 0.2 03/06/2020   Lab Results  Component Value Date   HGBA1C 6.4 (H) 03/06/2020   HGBA1C 6.4 (H) 10/18/2019   HGBA1C 6.4 (H) 05/17/2019   HGBA1C 6.6 (H) 10/11/2018   Lab Results  Component Value Date   INSULIN 11.3 03/06/2020   INSULIN 13.3 10/18/2019   INSULIN 11.5 10/11/2018   Lab Results  Component Value Date   TSH  1.880 10/11/2018   Lab Results  Component Value Date   CHOL 157 03/06/2020   HDL 44 03/06/2020   LDLCALC 100 (H) 03/06/2020   TRIG 66 03/06/2020   CHOLHDL 3.7 10/11/2018   Lab Results  Component Value Date   WBC 5.9 07/28/2018   HGB 14.0 07/28/2018   HCT 43.0 07/28/2018   MCV 88 07/28/2018   No results found for: IRON, TIBC, FERRITIN  Attestation Statements:   Reviewed by clinician on day of visit: allergies, medications, problem list, medical history, surgical history, family history, social history, and previous encounter notes.   Wilhemena Durie, am acting as Location manager for [employer], FNP-C.  I have reviewed the above documentation for accuracy and completeness, and I agree with the above. -  Georgianne Fick, FNP

## 2020-06-04 MED FILL — MONTELUKAST SOD 10 MG TAB: 10 | 30 days supply | Qty: 30 | Fill #5

## 2020-06-17 ENCOUNTER — Ambulatory Visit (INDEPENDENT_AMBULATORY_CARE_PROVIDER_SITE_OTHER): Payer: 59 | Admitting: Family Medicine

## 2020-06-25 MED FILL — CHLORTHALIDONE 25 MG TABS: 25 | 30 days supply | Qty: 30 | Fill #0

## 2020-06-28 MED FILL — SYMBICORT 160-4.5 MCG INH: 160-4.5 | 30 days supply | Qty: 10 | Fill #1

## 2020-06-28 MED FILL — TRAVOPROST (BAK FREE) 0.004: 0.004 | 25 days supply | Qty: 3 | Fill #1

## 2020-07-10 ENCOUNTER — Other Ambulatory Visit (HOSPITAL_COMMUNITY): Payer: Self-pay | Admitting: Family Medicine

## 2020-07-10 MED FILL — METFORMIN HCL 500 MG TABS: 500 | 30 days supply | Qty: 30 | Fill #0

## 2020-07-10 MED FILL — ATORVASTATIN CALCIUM 10 MG: 10 | 30 days supply | Qty: 30 | Fill #0

## 2020-07-17 ENCOUNTER — Other Ambulatory Visit (HOSPITAL_COMMUNITY): Payer: Self-pay | Admitting: Student

## 2020-07-17 MED FILL — CELECOXIB 200 MG CAP: 200 | 30 days supply | Qty: 30 | Fill #0

## 2020-07-29 DIAGNOSIS — Z1231 Encounter for screening mammogram for malignant neoplasm of breast: Secondary | ICD-10-CM

## 2020-08-07 ENCOUNTER — Other Ambulatory Visit (HOSPITAL_COMMUNITY): Payer: Self-pay | Admitting: Family Medicine

## 2020-08-08 MED FILL — CHLORTHALIDONE 25 MG TABS: 25 | 30 days supply | Qty: 30 | Fill #0

## 2020-08-15 ENCOUNTER — Other Ambulatory Visit (HOSPITAL_COMMUNITY): Payer: Self-pay | Admitting: Family Medicine

## 2020-08-15 DIAGNOSIS — E559 Vitamin D deficiency, unspecified: Secondary | ICD-10-CM | POA: Diagnosis not present

## 2020-08-15 DIAGNOSIS — E7800 Pure hypercholesterolemia, unspecified: Secondary | ICD-10-CM | POA: Diagnosis not present

## 2020-08-15 DIAGNOSIS — I10 Essential (primary) hypertension: Secondary | ICD-10-CM | POA: Diagnosis not present

## 2020-08-15 DIAGNOSIS — E1169 Type 2 diabetes mellitus with other specified complication: Secondary | ICD-10-CM | POA: Diagnosis not present

## 2020-08-15 MED FILL — METFORMIN HCL 500 MG TABS: 500 | 90 days supply | Qty: 90 | Fill #0

## 2020-08-18 ENCOUNTER — Ambulatory Visit: Payer: 59 | Admitting: Certified Nurse Midwife

## 2020-08-24 MED FILL — CELECOXIB 200 MG CAP: 200 | 30 days supply | Qty: 30 | Fill #1

## 2020-08-25 ENCOUNTER — Other Ambulatory Visit: Payer: Self-pay | Admitting: Allergy

## 2020-08-25 ENCOUNTER — Other Ambulatory Visit (HOSPITAL_COMMUNITY): Payer: Self-pay | Admitting: Family Medicine

## 2020-08-25 MED FILL — ATORVASTATIN CALCIUM 10 MG: 10 | 30 days supply | Qty: 30 | Fill #0

## 2020-08-25 MED FILL — TRAVOPROST (BAK FREE) 0.004: 0.004 | 25 days supply | Qty: 3 | Fill #0

## 2020-08-25 MED FILL — MONTELUKAST SOD 10 MG TAB: 10 | 30 days supply | Qty: 30 | Fill #0

## 2020-08-25 MED FILL — VALACYCLOVIR HCL 500 MG TAB: 500 | 30 days supply | Qty: 30 | Fill #2

## 2020-08-25 NOTE — Telephone Encounter (Signed)
Refills for Montelukast sent in, and patient is scheduled to see Dr. Nelva Bush on 11/07/2020 at 11:20 AM.

## 2020-08-26 ENCOUNTER — Other Ambulatory Visit (HOSPITAL_COMMUNITY): Payer: Self-pay | Admitting: Family Medicine

## 2020-08-26 DIAGNOSIS — L259 Unspecified contact dermatitis, unspecified cause: Secondary | ICD-10-CM | POA: Diagnosis not present

## 2020-08-26 MED FILL — TRIAMCINOLONE ACETONIDE 0.5: 0.5 | 14 days supply | Qty: 30 | Fill #0

## 2020-08-28 ENCOUNTER — Other Ambulatory Visit: Payer: Self-pay | Admitting: Family Medicine

## 2020-08-28 DIAGNOSIS — Z1231 Encounter for screening mammogram for malignant neoplasm of breast: Secondary | ICD-10-CM

## 2020-09-10 ENCOUNTER — Other Ambulatory Visit (HOSPITAL_COMMUNITY): Payer: Self-pay | Admitting: Family Medicine

## 2020-09-10 MED FILL — CHLORTHALIDONE 25 MG TABS: 25 | 90 days supply | Qty: 90 | Fill #0

## 2020-09-12 ENCOUNTER — Ambulatory Visit: Payer: 59 | Attending: Internal Medicine

## 2020-09-12 DIAGNOSIS — Z23 Encounter for immunization: Secondary | ICD-10-CM

## 2020-09-12 NOTE — Progress Notes (Signed)
   Covid-19 Vaccination Clinic  Name:  Hannah Gibson    MRN: [id number] DOB: [birth year]  09/12/2020  Ms. Hannah Gibson was observed post Covid-19 immunization for 15 minutes without incident. She was provided with Vaccine Information Sheet and instruction to access the V-Safe system.   Ms. Hannah Gibson was instructed to call 911 with any severe reactions post vaccine: Marland Kitchen Difficulty breathing  . Swelling of face and throat  . A fast heartbeat  . A bad rash all over body  . Dizziness and weakness   Immunizations Administered    Name Date Dose VIS Date Route   Pfizer COVID-19 Vaccine 09/12/2020  2:03 PM 0.3 mL 07/16/2020 Intramuscular   Manufacturer: [place]   Lot: [id number]   [place]: [id number]

## 2020-09-22 MED FILL — CELECOXIB 200 MG CAP: 200 | 30 days supply | Qty: 30 | Fill #2

## 2020-10-17 MED FILL — MONTELUKAST SOD 10 MG TAB: 10 | 30 days supply | Qty: 30 | Fill #1

## 2020-10-25 MED FILL — ATORVASTATIN CALCIUM 10 MG: 10 | 30 days supply | Qty: 30 | Fill #1

## 2020-10-25 MED FILL — TRAVOPROST (BAK FREE) 0.004: 0.004 | 25 days supply | Qty: 3 | Fill #1

## 2020-10-29 ENCOUNTER — Other Ambulatory Visit (HOSPITAL_COMMUNITY): Payer: Self-pay | Admitting: Student

## 2020-10-29 MED FILL — CELECOXIB 200 MG CAP: 200 | 30 days supply | Qty: 30 | Fill #0

## 2020-11-07 ENCOUNTER — Encounter: Payer: Self-pay | Admitting: Allergy

## 2020-11-07 ENCOUNTER — Ambulatory Visit (INDEPENDENT_AMBULATORY_CARE_PROVIDER_SITE_OTHER): Payer: 59 | Admitting: Allergy

## 2020-11-07 ENCOUNTER — Ambulatory Visit
Admission: RE | Admit: 2020-11-07 | Discharge: 2020-11-07 | Disposition: A | Discharge status: Home / Self Care | Payer: 59 | Source: Ambulatory Visit | Attending: Family Medicine | Admitting: Family Medicine

## 2020-11-07 ENCOUNTER — Other Ambulatory Visit: Payer: Self-pay

## 2020-11-07 VITALS — BP 120/80 | HR 56 | Temp 97.7°F | Resp 18 | Ht 65.0 in | Wt 258.6 lb

## 2020-11-07 DIAGNOSIS — J4550 Severe persistent asthma, uncomplicated: Secondary | ICD-10-CM | POA: Diagnosis not present

## 2020-11-07 DIAGNOSIS — J310 Chronic rhinitis: Secondary | ICD-10-CM

## 2020-11-07 DIAGNOSIS — Z1231 Encounter for screening mammogram for malignant neoplasm of breast: Secondary | ICD-10-CM | POA: Diagnosis not present

## 2020-11-07 NOTE — Progress Notes (Signed)
Follow-up Note  RE: Hannah Gibson MRN: [id number] DOB: 1961-03-23 Date of Office Visit: 11/07/2020   History of present illness: Hannah Gibson is a 60 y.o. female presenting today for follow-up of asthma, nonallergic rhinitis.  She was last seen in the office on 11/01/2019 by myself.  She states she has been doing well without any major health changes, surgeries or hospitalizations.  She has had 3 doses of the Covid vaccine. She states her asthma has been under good control and she has not needed to use her albuterol inhaler she believes in the past year.  She does continue to take Singulair daily.  She uses Symbicort as needed at this time.  She has not required any ED or urgent care visits or any systemic steroid needs. She does feel that her congestion seems to get worse with season changes for about 2 to 3 weeks and then is better.  She does have XHANCE that she uses that does help with congestion control.     Review of systems: Review of Systems  Constitutional: Negative.   HENT: Negative.   Eyes: Negative.   Respiratory: Negative.   Cardiovascular: Negative.   Gastrointestinal: Negative.   Musculoskeletal: Negative.   Skin: Negative.   Neurological: Negative.     All other systems negative unless noted above in HPI  Past medical/social/surgical/family history have been reviewed and are unchanged unless specifically indicated below.  No changes  Medication List: Current Outpatient Medications  Medication Sig Dispense Refill  . albuterol (VENTOLIN HFA) 108 (90 Base) MCG/ACT inhaler Inhale 2 puffs into the lungs every 6 (six) hours as needed for wheezing or shortness of breath. 18 g 1  . atorvastatin (LIPITOR) 20 MG tablet Take 1 tablet (20 mg total) by mouth daily. 30 tablet 0  . budesonide-formoterol (SYMBICORT) 160-4.5 MCG/ACT inhaler Inhale 2 puffs into the lungs 2 (two) times daily. 1 Inhaler 5  . celecoxib (CELEBREX) 200 MG capsule 1 capsule with food    .  chlorthalidone (HYGROTON) 25 MG tablet Take 1 tablet (25 mg total) by mouth daily. 30 tablet 0  . Fluticasone Propionate (XHANCE) 93 MCG/ACT EXHU Place 2 sprays into both nostrils in the morning and at bedtime. 32 mL 12  . metFORMIN (GLUCOPHAGE) 500 MG tablet Take 1 tablet (500 mg total) by mouth daily with breakfast. 30 tablet 0  . montelukast (SINGULAIR) 10 MG tablet TAKE 1 TABLET BY MOUTH AT BEDTIME 30 tablet 3  . Multiple Vitamin (MULTIVITAMIN) tablet Take 1 tablet by mouth daily.    . TRAVATAN Z 0.004 % SOLN ophthalmic solution   3  . valACYclovir (VALTREX) 500 MG tablet valacyclovir 500 mg tablet 90 tablet 2  . DUEXIS 800-26.6 MG TABS Take 1 tablet by mouth 3 (three) times daily. (Patient not taking: Reported on 11/07/2020)  6  . Vitamin D, Ergocalciferol, (DRISDOL) 1.25 MG (50000 UNIT) CAPS capsule Take 1 capsule (50,000 Units total) by mouth every 14 (fourteen) days. (Patient not taking: Reported on 11/07/2020) 6 capsule 0   No current facility-administered medications for this visit.     Known medication allergies: Allergies  Allergen Reactions  . Latex Itching and Rash     Physical examination: Blood pressure 120/80, pulse (!) 56, temperature 97.7 F (36.5 C), resp. rate 18, height 5\' 5"  (1.651 m), weight 258 lb 9.6 oz (117.3 kg), last menstrual period 12/09/1997, SpO2 97 %.  General: Alert, interactive, in no acute distress. HEENT: PERRLA, TMs pearly gray, turbinates moderately edematous  with clear discharge, post-pharynx non erythematous. Neck: Supple without lymphadenopathy. Lungs: Clear to auscultation without wheezing, rhonchi or rales. {no increased work of breathing. CV: Normal S1, S2 without murmurs. Abdomen: Nondistended, nontender. Skin: Warm and dry, without lesions or rashes. Extremities:  No clubbing, cyanosis or edema. Neuro:   Grossly intact.  Diagnositics/Labs:  Spirometry: FEV1: 2.57L  114%, FVC: 2.99L 105%, ratio consistent with nonobstructive  pattern  Assessment and plan:   Asthma, severe persistent Under good control -Lung function testing looks great today! - have access to albuterol inhaler 2 puffs every 4-6 hours as needed for cough/wheeze/shortness of breath/chest tightness.  May use 15-20 minutes prior to activity.   Monitor frequency of use.   - continue singulair 10mg  daily - take at bedtime.   Will refill today - Asthma action plan--at first sign of upper respiratory illness or flare start the following: Symbicort 154mcg 2 puff 2 times a day.    If you are not meeting below goal then take Symbicort 2 puffs 2 times a day as maintenance therapy.  Asthma control goals:   Full participation in all desired activities (may need albuterol before activity)  Albuterol use two time or less a week on average (not counting use with activity)  Cough interfering with sleep two time or less a month  Oral steroids no more than once a year  No hospitalizations  Nasal congestion, non-allergic - environmental allergy testing has been negative on both skin testing and serum IgE testing  - continue use of Xhance nasal spray device.  Use 2 sprays each nostril twice a day as needed.  This spray is Fluticasone however the device allows for deeper deposition of the medication into the sinuses for better improvement.  - continue singulair as above  - recommend use of saline nasal rinse prior to nasal spray use to help clean out the nose and keep nose moisturized   Follow-up in 6-12 months or sooner if needed I appreciate the opportunity to take part in Samantha's care. Please do not hesitate to contact me with questions.  Sincerely,   Prudy Feeler, MD Allergy/[facility]ology [facility]

## 2020-11-07 NOTE — Patient Instructions (Signed)
Asthma -Lung function testing looks great today! - have access to albuterol inhaler 2 puffs every 4-6 hours as needed for cough/wheeze/shortness of breath/chest tightness.  May use 15-20 minutes prior to activity.   Monitor frequency of use.   - continue singulair 10mg  daily - take at bedtime.   Will refill today - Asthma action plan--at first sign of upper respiratory illness or flare start the following: Symbicort 128mcg 2 puff 2 times a day.    If you are not meeting below goal then take Symbicort 2 puffs 2 times a day as maintenance therapy.  Asthma control goals:   Full participation in all desired activities (may need albuterol before activity)  Albuterol use two time or less a week on average (not counting use with activity)  Cough interfering with sleep two time or less a month  Oral steroids no more than once a year  No hospitalizations  Nasal congestion, non-allergic - environmental allergy testing has been negative on both skin testing and serum IgE testing  - continue use of Xhance nasal spray device.  Use 2 sprays each nostril twice a day as needed.  This spray is Fluticasone however the device allows for deeper deposition of the medication into the sinuses for better improvement.  - continue singulair as above  - recommend use of saline nasal rinse prior to nasal spray use to help clean out the nose and keep nose moisturized   Follow-up in 6-12 months or sooner if needed

## 2020-11-10 DIAGNOSIS — H401131 Primary open-angle glaucoma, bilateral, mild stage: Secondary | ICD-10-CM | POA: Diagnosis not present

## 2020-11-15 IMAGING — US US BREAST*R* LIMITED INC AXILLA
1 series · 3 of 3 positions shown · non-contrast
Comparison: Previous exam(s).

CLINICAL DATA: Patient's referring clinician reports a lump in the
right breast at 8 o'clock, 2-3 cm from the nipple. The patient does
feel this.

EXAM:
DIGITAL DIAGNOSTIC BILATERAL MAMMOGRAM WITH CAD AND TOMO
ULTRASOUND RIGHT BREAST

[Series 1: us breast*right* limited inc axilla · 0.07mm/px · 3 of 3 slices shown]
[im 1/3]
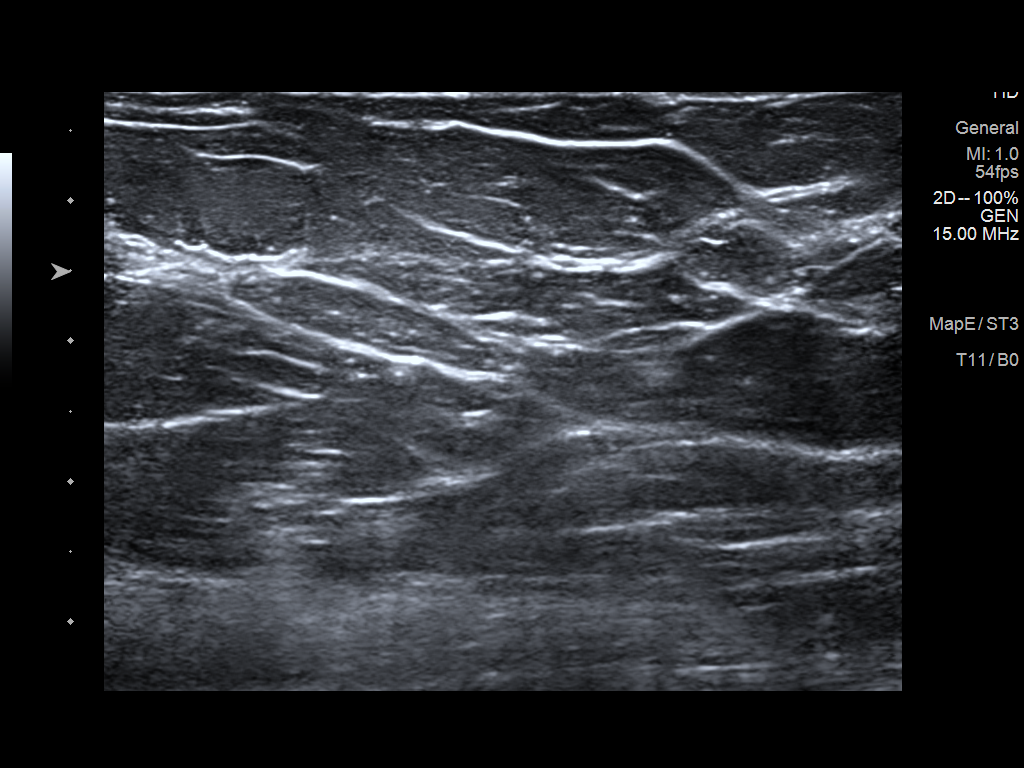
[im 2/3]
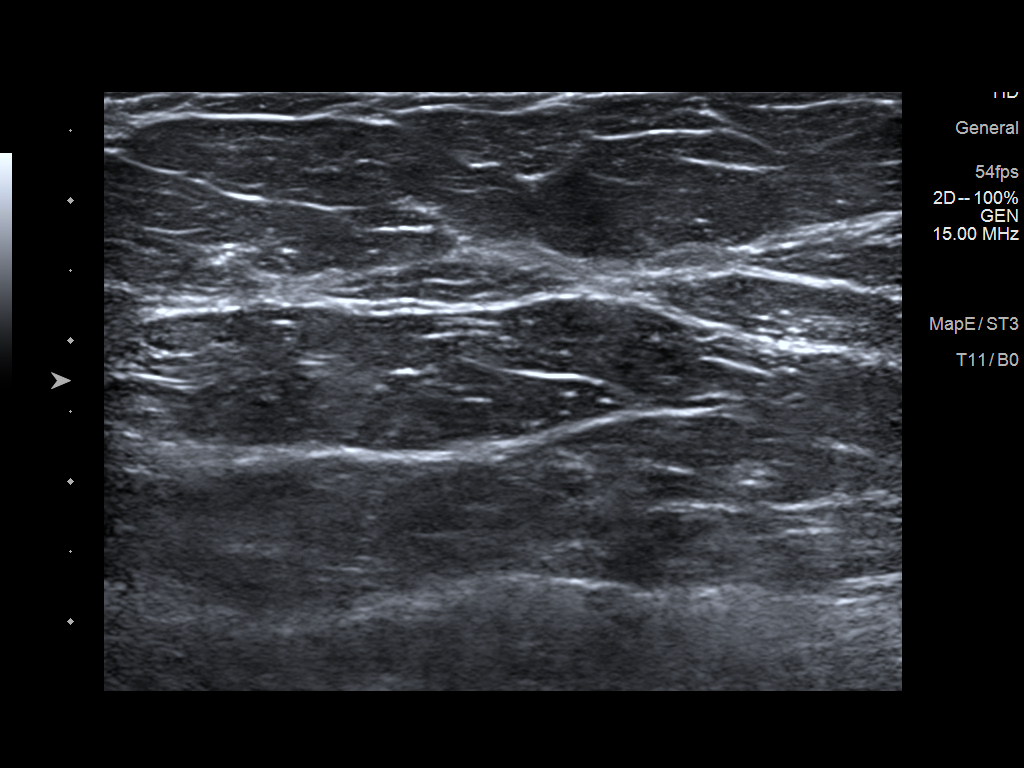
[im 3/3]
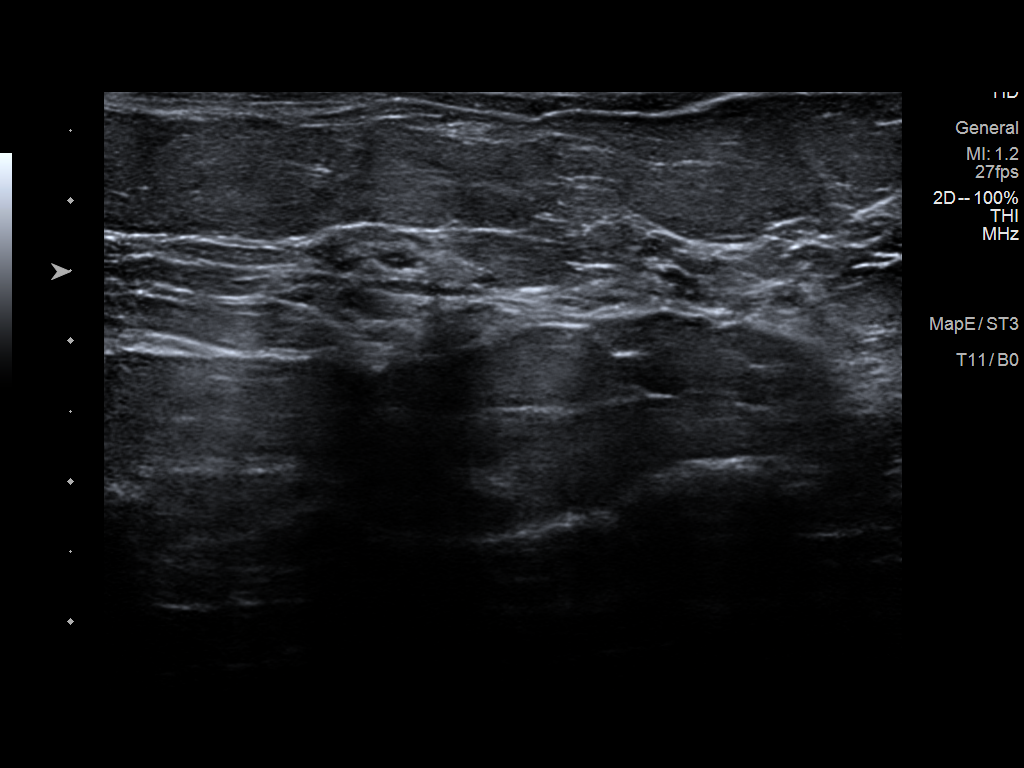

[3 of 3 positions shown; findings below may reference images not displayed]

ACR Breast Density Category b: There are scattered areas of
fibroglandular density.
FINDINGS: There are no suspicious masses, areas of architectural distortion,
areas of significant asymmetry or new or suspicious calcifications.
No mammographic change.

Mammographic images were processed with CAD.

On physical exam, no mass is palpated in the lower outer right
breast.

Targeted ultrasound is performed, showing heterogeneous
fibroglandular tissue throughout the lower outer right breast and
lateral right breast. No mass or suspicious lesion.
IMPRESSION: Negative exam.  No evidence of breast malignancy.

RECOMMENDATION:
Screening mammogram in one year.(Code:MS-5-6VG)

I have discussed the findings and recommendations with the patient.
If applicable, a reminder letter will be sent to the patient
regarding the next appointment.

BI-RADS CATEGORY  1: Negative.

## 2020-11-15 IMAGING — MG DIGITAL DIAGNOSTIC BILAT W/ TOMO W/ CAD
8 of 14 series · 8 of 40 positions shown · non-contrast
Comparison: Previous exam(s).

CLINICAL DATA: Patient's referring clinician reports a lump in the
right breast at 8 o'clock, 2-3 cm from the nipple. The patient does
feel this.

EXAM:
DIGITAL DIAGNOSTIC BILATERAL MAMMOGRAM WITH CAD AND TOMO
ULTRASOUND RIGHT BREAST

[L CV synth-2D]
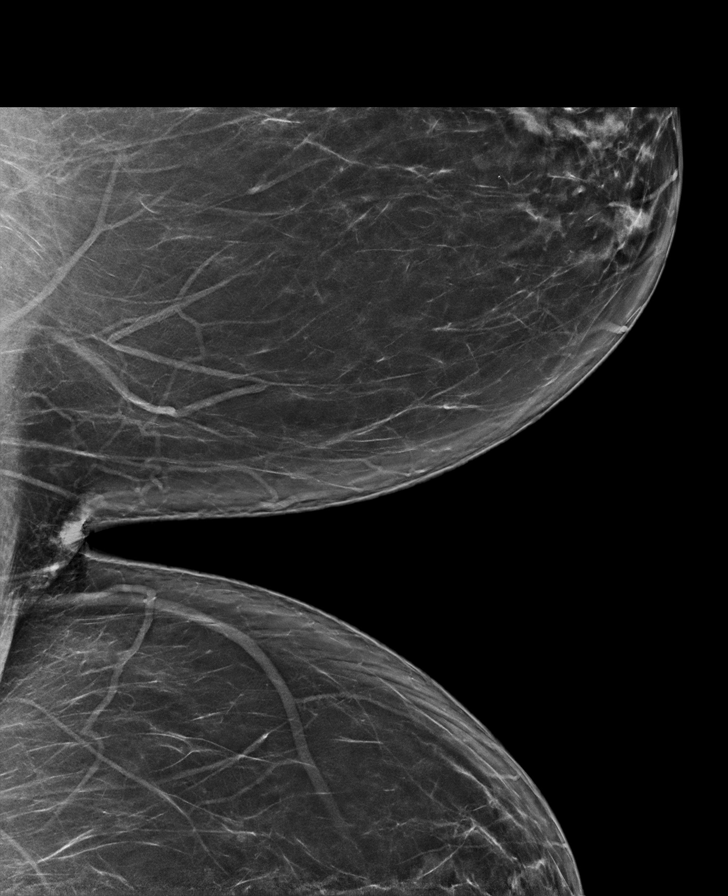

[L CC synth-2D]
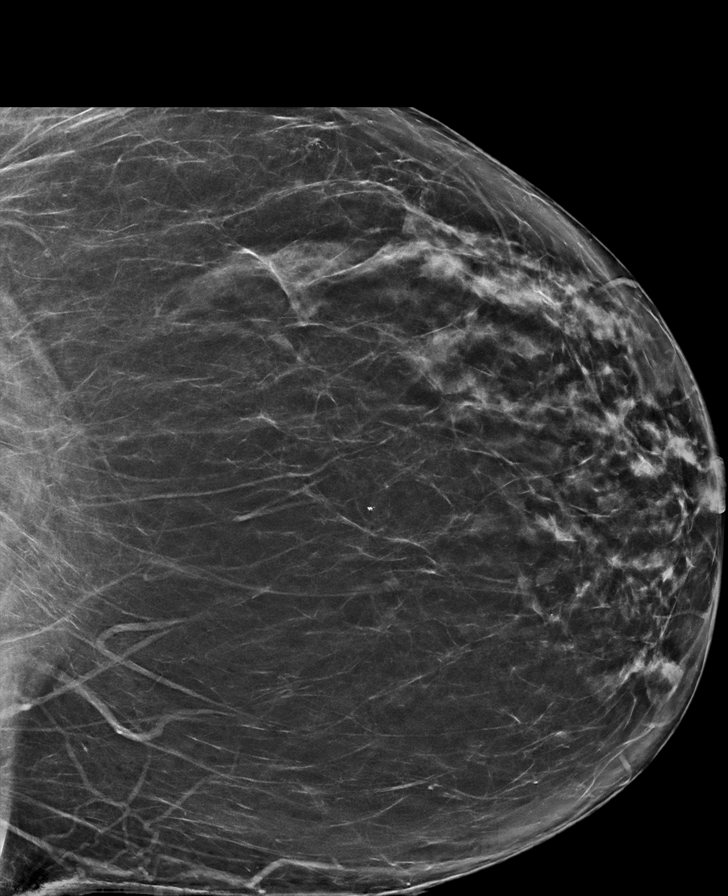

[R CC synth-2D]
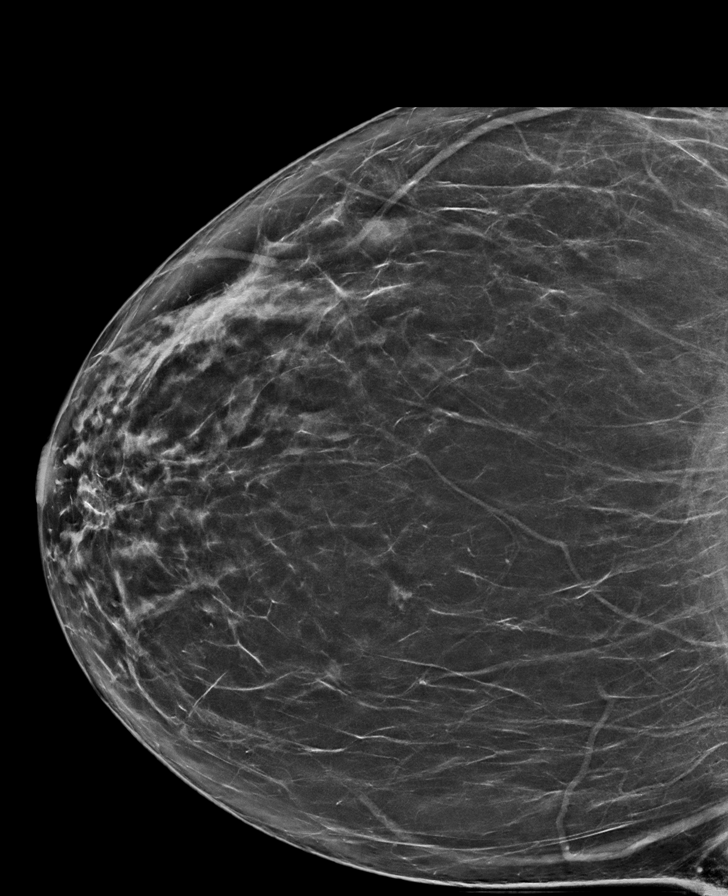

[R MLO synth-2D (1 of 2)]
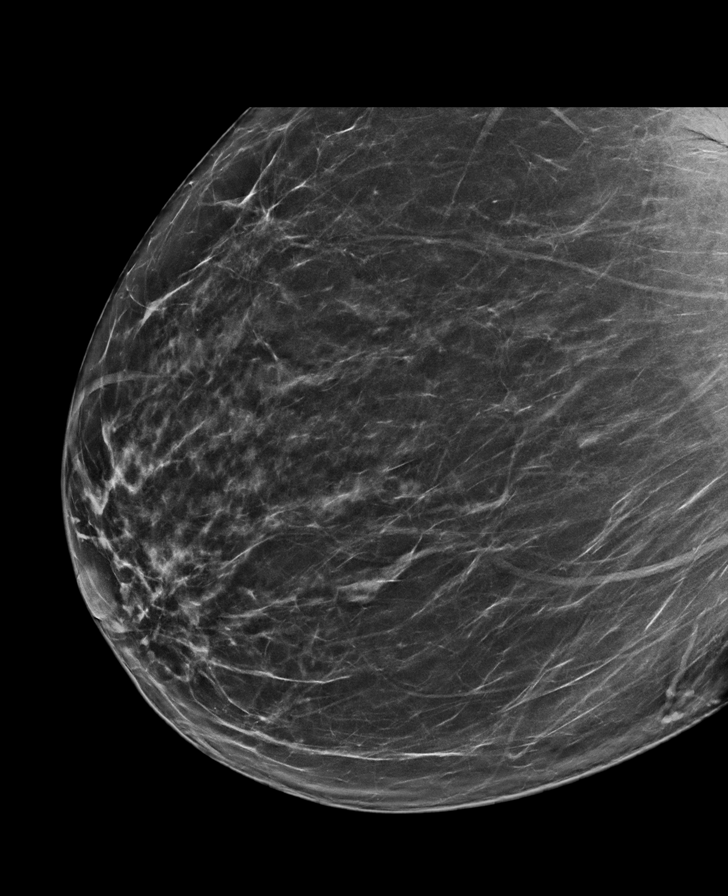

[R MLO synth-2D (2 of 2)]
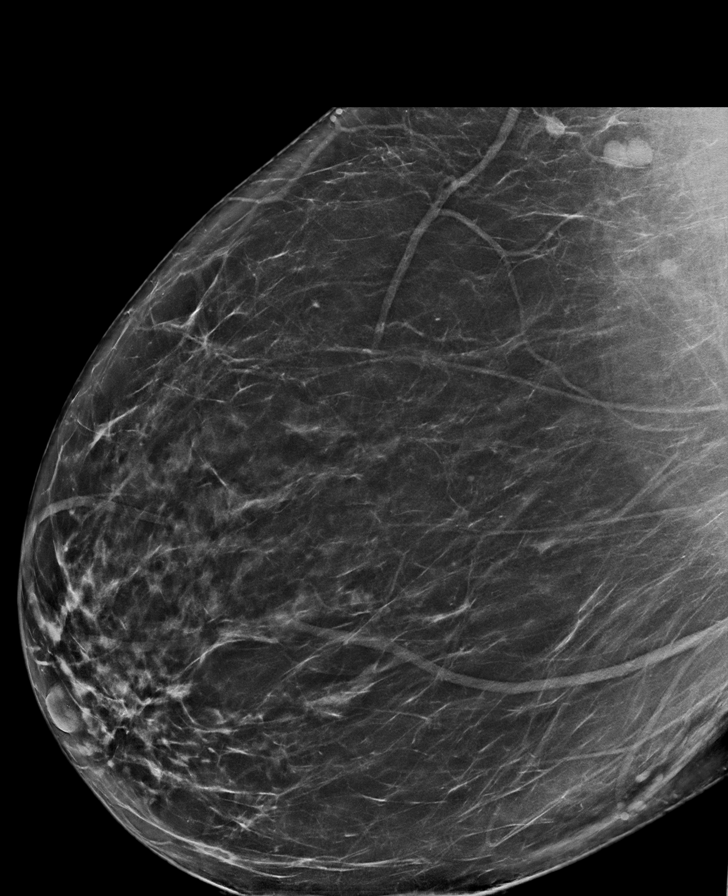

[L MLO synth-2D (1 of 2)]
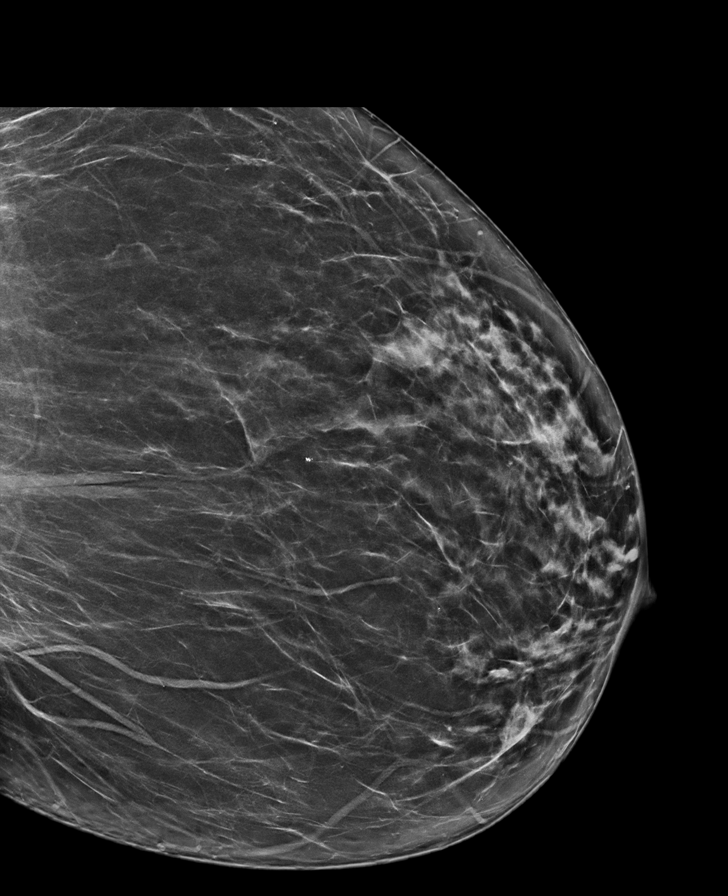

[L MLO synth-2D (2 of 2)]
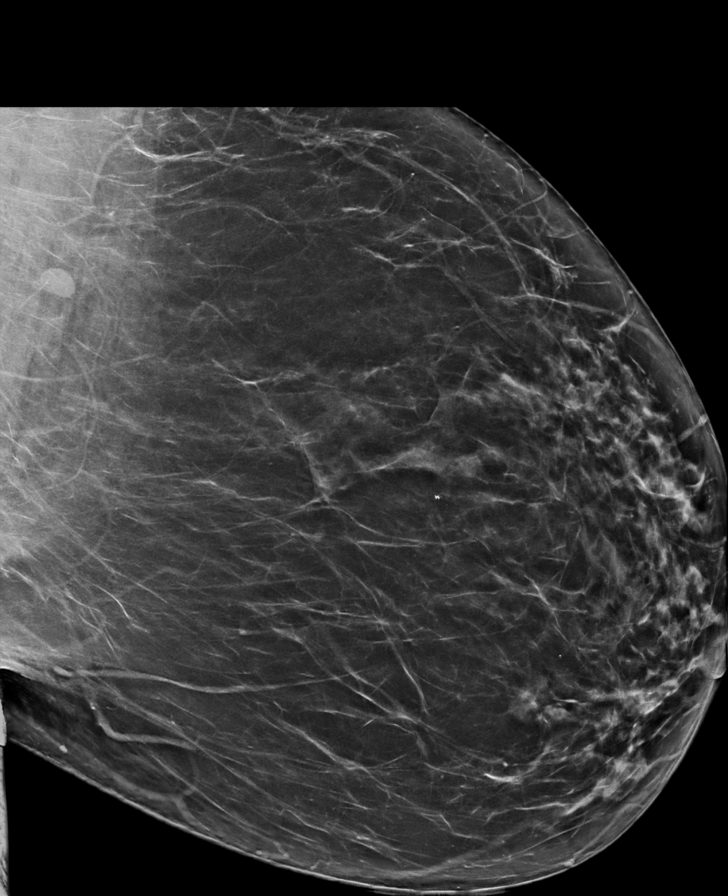

[L CV tomo · tomo slice 39/77.0]
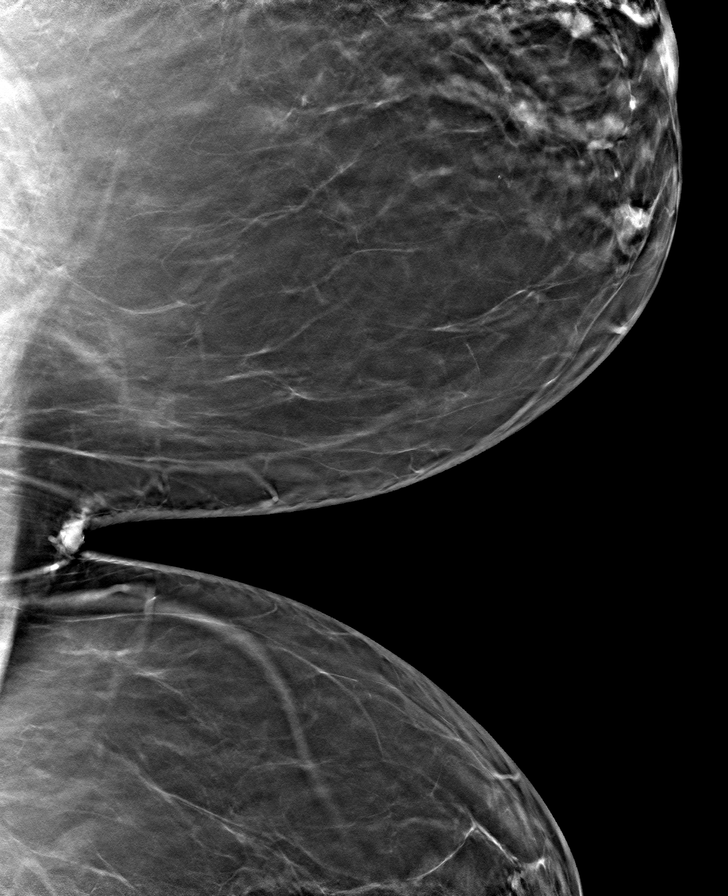

[8 of 40 positions shown; findings below may reference images not displayed]

ACR Breast Density Category b: There are scattered areas of
fibroglandular density.
FINDINGS: There are no suspicious masses, areas of architectural distortion,
areas of significant asymmetry or new or suspicious calcifications.
No mammographic change.

Mammographic images were processed with CAD.

On physical exam, no mass is palpated in the lower outer right
breast.

Targeted ultrasound is performed, showing heterogeneous
fibroglandular tissue throughout the lower outer right breast and
lateral right breast. No mass or suspicious lesion.
IMPRESSION: Negative exam.  No evidence of breast malignancy.

RECOMMENDATION:
Screening mammogram in one year.(Code:MS-5-6VG)

I have discussed the findings and recommendations with the patient.
If applicable, a reminder letter will be sent to the patient
regarding the next appointment.

BI-RADS CATEGORY  1: Negative.

## 2020-12-06 MED FILL — METFORMIN HCL 500 MG TABS: 500 | 90 days supply | Qty: 90 | Fill #1

## 2020-12-06 MED FILL — CELECOXIB 200 MG CAP: 200 | 30 days supply | Qty: 30 | Fill #1

## 2020-12-06 MED FILL — MONTELUKAST SOD 10 MG TAB: 10 | 30 days supply | Qty: 30 | Fill #2

## 2020-12-06 MED FILL — ATORVASTATIN 10 MG TABLET: 10 | 30 days supply | Qty: 30 | Fill #2

## 2020-12-18 MED FILL — CHLORTHALIDONE 25 MG TABS: 25 | 90 days supply | Qty: 90 | Fill #1

## 2020-12-22 ENCOUNTER — Other Ambulatory Visit: Payer: Self-pay | Admitting: *Deleted

## 2020-12-22 MED ORDER — XHANCE 93 MCG/ACT NA EXHU: 2.0000 | INHALANT_SUSPENSION | Freq: Two times a day (BID) | NASAL | 12 refills | Status: AC

## 2020-12-24 ENCOUNTER — Other Ambulatory Visit (HOSPITAL_COMMUNITY): Payer: Self-pay | Admitting: Optometry

## 2020-12-24 MED FILL — TRAVOPROST (BAK FREE) 0.004: 0.004 | 25 days supply | Qty: 3 | Fill #0

## 2020-12-29 ENCOUNTER — Telehealth: Payer: Self-pay | Admitting: *Deleted

## 2020-12-29 NOTE — Telephone Encounter (Signed)
PA has been submitted through CoverMyMeds for Xhance and is currently pending approval/denial.  

## 2020-12-31 NOTE — Telephone Encounter (Signed)
Pa has been approved thru med impact for [employer] 

## 2021-01-04 MED FILL — Celecoxib Cap 200 MG: ORAL | 30 days supply | Qty: 30 | Fill #0 | Status: AC

## 2021-01-05 ENCOUNTER — Other Ambulatory Visit (HOSPITAL_COMMUNITY): Payer: Self-pay

## 2021-02-09 ENCOUNTER — Other Ambulatory Visit (HOSPITAL_COMMUNITY): Payer: Self-pay | Admitting: Student

## 2021-02-09 DIAGNOSIS — E119 Type 2 diabetes mellitus without complications: Secondary | ICD-10-CM | POA: Diagnosis not present

## 2021-02-10 ENCOUNTER — Other Ambulatory Visit (HOSPITAL_COMMUNITY): Payer: Self-pay

## 2021-02-10 MED ORDER — CELECOXIB 200 MG PO CAPS
ORAL_CAPSULE | Freq: Every day | ORAL | 2 refills | Status: DC
Start: 2021-02-10 — End: 2021-05-17
  Filled 2021-02-10: qty 30, 30d supply, fill #0
  Filled 2021-03-15: qty 30, 30d supply, fill #1
  Filled 2021-04-15: qty 30, 30d supply, fill #2

## 2021-02-13 ENCOUNTER — Other Ambulatory Visit (HOSPITAL_COMMUNITY): Payer: Self-pay

## 2021-02-13 DIAGNOSIS — J309 Allergic rhinitis, unspecified: Secondary | ICD-10-CM | POA: Diagnosis not present

## 2021-02-13 DIAGNOSIS — I10 Essential (primary) hypertension: Secondary | ICD-10-CM | POA: Diagnosis not present

## 2021-02-13 DIAGNOSIS — Z86010 Personal history of colonic polyps: Secondary | ICD-10-CM | POA: Diagnosis not present

## 2021-02-13 DIAGNOSIS — J45909 Unspecified asthma, uncomplicated: Secondary | ICD-10-CM | POA: Diagnosis not present

## 2021-02-13 DIAGNOSIS — E559 Vitamin D deficiency, unspecified: Secondary | ICD-10-CM | POA: Diagnosis not present

## 2021-02-13 DIAGNOSIS — Z23 Encounter for immunization: Secondary | ICD-10-CM | POA: Diagnosis not present

## 2021-02-13 DIAGNOSIS — E1169 Type 2 diabetes mellitus with other specified complication: Secondary | ICD-10-CM | POA: Diagnosis not present

## 2021-02-13 DIAGNOSIS — E7800 Pure hypercholesterolemia, unspecified: Secondary | ICD-10-CM | POA: Diagnosis not present

## 2021-02-13 DIAGNOSIS — Z0000 Encounter for general adult medical examination without abnormal findings: Secondary | ICD-10-CM | POA: Diagnosis not present

## 2021-02-13 MED ORDER — ATORVASTATIN CALCIUM 10 MG PO TABS
10.0000 mg | ORAL_TABLET | Freq: Every day | ORAL | 3 refills | Status: AC
  Filled 2021-02-13: qty 90, 90d supply, fill #0
  Filled 2021-07-20: qty 90, 90d supply, fill #1

## 2021-02-13 MED ORDER — CHLORTHALIDONE 25 MG PO TABS
25.0000 mg | ORAL_TABLET | Freq: Every morning | ORAL | 3 refills | Status: AC
  Filled 2021-02-13: qty 90, 90d supply, fill #0

## 2021-02-13 MED ORDER — METFORMIN HCL 500 MG PO TABS
500.0000 mg | ORAL_TABLET | Freq: Every day | ORAL | 3 refills | Status: AC
  Filled 2021-02-13 – 2021-02-17 (×2): qty 90, 90d supply, fill #0
  Filled 2021-07-20: qty 90, 90d supply, fill #1
  Filled 2021-11-07: qty 90, 90d supply, fill #2

## 2021-02-14 ENCOUNTER — Other Ambulatory Visit (HOSPITAL_COMMUNITY): Payer: Self-pay

## 2021-02-15 MED FILL — Atorvastatin Calcium Tab 10 MG (Base Equivalent): ORAL | 30 days supply | Qty: 30 | Fill #0 | Status: CN

## 2021-02-15 MED FILL — Montelukast Sodium Tab 10 MG (Base Equiv): ORAL | 30 days supply | Qty: 30 | Fill #0 | Status: AC

## 2021-02-15 MED FILL — Travoprost Ophth Soln 0.004% (Benzalkonium Free) (BAK Free): OPHTHALMIC | 50 days supply | Qty: 5 | Fill #0 | Status: AC

## 2021-02-16 ENCOUNTER — Other Ambulatory Visit (HOSPITAL_COMMUNITY): Payer: Self-pay

## 2021-02-17 ENCOUNTER — Other Ambulatory Visit (HOSPITAL_COMMUNITY): Payer: Self-pay

## 2021-03-16 ENCOUNTER — Other Ambulatory Visit (HOSPITAL_COMMUNITY): Payer: Self-pay

## 2021-04-15 ENCOUNTER — Other Ambulatory Visit: Payer: Self-pay | Admitting: Allergy

## 2021-04-15 ENCOUNTER — Other Ambulatory Visit (HOSPITAL_COMMUNITY): Payer: Self-pay

## 2021-04-15 MED ORDER — VALACYCLOVIR HCL 500 MG PO TABS
ORAL_TABLET | ORAL | 1 refills | Status: AC
  Filled 2021-04-15: qty 30, 15d supply, fill #0
  Filled 2021-07-22: qty 30, 15d supply, fill #1

## 2021-04-15 MED ORDER — MONTELUKAST SODIUM 10 MG PO TABS
10.0000 mg | ORAL_TABLET | Freq: Every day | ORAL | 5 refills | Status: DC
  Filled 2021-04-15: qty 30, 30d supply, fill #0
  Filled 2021-05-17: qty 30, 30d supply, fill #1
  Filled 2021-07-20: qty 30, 30d supply, fill #2
  Filled 2021-10-07: qty 30, 30d supply, fill #3
  Filled 2021-11-07: qty 30, 30d supply, fill #4

## 2021-04-15 MED FILL — Chlorthalidone Tab 25 MG: ORAL | 90 days supply | Qty: 90 | Fill #0 | Status: AC

## 2021-04-29 ENCOUNTER — Other Ambulatory Visit (HOSPITAL_COMMUNITY): Payer: Self-pay

## 2021-04-30 ENCOUNTER — Other Ambulatory Visit (HOSPITAL_COMMUNITY): Payer: Self-pay

## 2021-04-30 ENCOUNTER — Other Ambulatory Visit: Payer: Self-pay

## 2021-04-30 MED ORDER — BUDESONIDE-FORMOTEROL FUMARATE 160-4.5 MCG/ACT IN AERO
2.0000 | INHALATION_SPRAY | Freq: Two times a day (BID) | RESPIRATORY_TRACT | 5 refills | Status: AC
  Filled 2021-04-30: qty 10.2, 30d supply, fill #0

## 2021-05-01 ENCOUNTER — Other Ambulatory Visit (HOSPITAL_COMMUNITY): Payer: Self-pay

## 2021-05-04 ENCOUNTER — Other Ambulatory Visit (HOSPITAL_COMMUNITY): Payer: Self-pay

## 2021-05-05 ENCOUNTER — Other Ambulatory Visit (HOSPITAL_COMMUNITY): Payer: Self-pay

## 2021-05-12 DIAGNOSIS — H401131 Primary open-angle glaucoma, bilateral, mild stage: Secondary | ICD-10-CM | POA: Diagnosis not present

## 2021-05-17 ENCOUNTER — Other Ambulatory Visit (HOSPITAL_COMMUNITY): Payer: Self-pay | Admitting: Student

## 2021-05-17 MED FILL — Travoprost Ophth Soln 0.004% (Benzalkonium Free) (BAK Free): OPHTHALMIC | 50 days supply | Qty: 5 | Fill #1 | Status: AC

## 2021-05-18 ENCOUNTER — Other Ambulatory Visit (HOSPITAL_COMMUNITY): Payer: Self-pay | Admitting: Student

## 2021-05-18 ENCOUNTER — Other Ambulatory Visit (HOSPITAL_COMMUNITY): Payer: Self-pay

## 2021-05-19 ENCOUNTER — Other Ambulatory Visit (HOSPITAL_COMMUNITY): Payer: Self-pay

## 2021-05-19 MED ORDER — CELECOXIB 200 MG PO CAPS
ORAL_CAPSULE | Freq: Every day | ORAL | 2 refills | Status: DC
Start: 2021-05-19 — End: 2021-08-17
  Filled 2021-05-19: qty 30, 30d supply, fill #0
  Filled 2021-06-17: qty 30, 30d supply, fill #1
  Filled 2021-07-20: qty 30, 30d supply, fill #2

## 2021-05-29 DIAGNOSIS — Z20822 Contact with and (suspected) exposure to covid-19: Secondary | ICD-10-CM | POA: Diagnosis not present

## 2021-05-29 DIAGNOSIS — Z03818 Encounter for observation for suspected exposure to other biological agents ruled out: Secondary | ICD-10-CM | POA: Diagnosis not present

## 2021-06-18 ENCOUNTER — Other Ambulatory Visit (HOSPITAL_COMMUNITY): Payer: Self-pay

## 2021-07-20 ENCOUNTER — Other Ambulatory Visit (HOSPITAL_COMMUNITY): Payer: Self-pay

## 2021-07-20 MED FILL — Chlorthalidone Tab 25 MG: ORAL | 90 days supply | Qty: 90 | Fill #1 | Status: AC

## 2021-07-22 ENCOUNTER — Other Ambulatory Visit (HOSPITAL_COMMUNITY): Payer: Self-pay

## 2021-08-12 DIAGNOSIS — H401131 Primary open-angle glaucoma, bilateral, mild stage: Secondary | ICD-10-CM | POA: Diagnosis not present

## 2021-08-17 ENCOUNTER — Other Ambulatory Visit (HOSPITAL_COMMUNITY): Payer: Self-pay | Admitting: Student

## 2021-08-17 ENCOUNTER — Other Ambulatory Visit (HOSPITAL_COMMUNITY): Payer: Self-pay

## 2021-08-17 DIAGNOSIS — L259 Unspecified contact dermatitis, unspecified cause: Secondary | ICD-10-CM | POA: Diagnosis not present

## 2021-08-17 DIAGNOSIS — Z7984 Long term (current) use of oral hypoglycemic drugs: Secondary | ICD-10-CM | POA: Diagnosis not present

## 2021-08-17 DIAGNOSIS — E7800 Pure hypercholesterolemia, unspecified: Secondary | ICD-10-CM | POA: Diagnosis not present

## 2021-08-17 DIAGNOSIS — J45909 Unspecified asthma, uncomplicated: Secondary | ICD-10-CM | POA: Diagnosis not present

## 2021-08-17 DIAGNOSIS — E1169 Type 2 diabetes mellitus with other specified complication: Secondary | ICD-10-CM | POA: Diagnosis not present

## 2021-08-17 DIAGNOSIS — I10 Essential (primary) hypertension: Secondary | ICD-10-CM | POA: Diagnosis not present

## 2021-08-17 MED ORDER — TRIAMCINOLONE ACETONIDE 0.5 % EX CREA
TOPICAL_CREAM | CUTANEOUS | 0 refills | Status: AC
  Filled 2021-08-17: qty 60, 14d supply, fill #0

## 2021-08-17 MED ORDER — CELECOXIB 200 MG PO CAPS
ORAL_CAPSULE | Freq: Every day | ORAL | 2 refills | Status: DC
  Filled 2021-08-17: qty 30, 30d supply, fill #0
  Filled 2021-09-23: qty 30, 30d supply, fill #1
  Filled 2021-10-25: qty 30, 30d supply, fill #2

## 2021-08-17 MED FILL — Travoprost Ophth Soln 0.004% (Benzalkonium Free) (BAK Free): OPHTHALMIC | 25 days supply | Qty: 2.5 | Fill #2 | Status: AC

## 2021-08-18 ENCOUNTER — Other Ambulatory Visit (HOSPITAL_COMMUNITY): Payer: Self-pay

## 2021-09-23 ENCOUNTER — Other Ambulatory Visit (HOSPITAL_COMMUNITY): Payer: Self-pay

## 2021-09-24 ENCOUNTER — Other Ambulatory Visit (HOSPITAL_COMMUNITY): Payer: Self-pay

## 2021-09-24 MED ORDER — VALACYCLOVIR HCL 500 MG PO TABS
500.0000 mg | ORAL_TABLET | Freq: Two times a day (BID) | ORAL | 1 refills | Status: AC
  Filled 2021-09-24: qty 30, 15d supply, fill #0

## 2021-09-25 ENCOUNTER — Other Ambulatory Visit (HOSPITAL_COMMUNITY): Payer: Self-pay

## 2021-09-25 MED ORDER — VALACYCLOVIR HCL 500 MG PO TABS
ORAL_TABLET | ORAL | 1 refills | Status: AC
  Filled 2021-09-25: qty 30, 15d supply, fill #0

## 2021-10-07 ENCOUNTER — Other Ambulatory Visit (HOSPITAL_COMMUNITY): Payer: Self-pay

## 2021-10-07 MED ORDER — TRAVOPROST (BAK FREE) 0.004 % OP SOLN
OPHTHALMIC | 2 refills | Status: AC
  Filled 2021-10-07: qty 2.5, 25d supply, fill #0

## 2021-10-08 ENCOUNTER — Other Ambulatory Visit (HOSPITAL_COMMUNITY): Payer: Self-pay

## 2021-10-08 MED ORDER — TRAVOPROST (BAK FREE) 0.004 % OP SOLN
OPHTHALMIC | 3 refills | Status: AC
Start: 2021-10-08 — End: ?
  Filled 2021-10-08: qty 5, 50d supply, fill #0

## 2021-10-25 ENCOUNTER — Other Ambulatory Visit (HOSPITAL_COMMUNITY): Payer: Self-pay

## 2021-10-26 ENCOUNTER — Other Ambulatory Visit (HOSPITAL_COMMUNITY): Payer: Self-pay

## 2021-10-30 ENCOUNTER — Other Ambulatory Visit (HOSPITAL_COMMUNITY): Payer: Self-pay

## 2021-10-30 MED ORDER — COLCHICINE 0.6 MG PO TABS
ORAL_TABLET | ORAL | 0 refills | Status: DC
  Filled 2021-10-30: qty 30, 15d supply, fill #0

## 2021-11-07 ENCOUNTER — Other Ambulatory Visit (HOSPITAL_COMMUNITY): Payer: Self-pay

## 2021-11-09 ENCOUNTER — Other Ambulatory Visit (HOSPITAL_COMMUNITY): Payer: Self-pay

## 2021-11-09 MED ORDER — CHLORTHALIDONE 25 MG PO TABS
25.0000 mg | ORAL_TABLET | Freq: Every morning | ORAL | 3 refills | Status: AC
  Filled 2021-11-09: qty 90, 90d supply, fill #0

## 2021-11-12 ENCOUNTER — Other Ambulatory Visit (HOSPITAL_COMMUNITY): Payer: Self-pay

## 2021-11-13 ENCOUNTER — Ambulatory Visit: Payer: 59 | Admitting: Allergy

## 2021-11-21 ENCOUNTER — Other Ambulatory Visit (HOSPITAL_COMMUNITY): Payer: Self-pay

## 2021-12-08 ENCOUNTER — Other Ambulatory Visit: Payer: Self-pay | Admitting: Internal Medicine

## 2021-12-08 DIAGNOSIS — Z1231 Encounter for screening mammogram for malignant neoplasm of breast: Secondary | ICD-10-CM

## 2022-01-02 ENCOUNTER — Other Ambulatory Visit: Payer: Self-pay | Admitting: Allergy

## 2022-01-06 ENCOUNTER — Other Ambulatory Visit (HOSPITAL_COMMUNITY): Payer: Self-pay

## 2022-01-06 MED ORDER — TRAVOPROST (BAK FREE) 0.004 % OP SOLN
OPHTHALMIC | 3 refills | Status: AC
  Filled 2022-01-06: qty 5, 50d supply, fill #0
  Filled 2022-01-08: qty 2.5, 25d supply, fill #0

## 2022-01-06 MED ORDER — ATORVASTATIN CALCIUM 10 MG PO TABS
ORAL_TABLET | ORAL | 1 refills | Status: AC
  Filled 2022-01-06 – 2022-01-08 (×2): qty 90, 90d supply, fill #0

## 2022-01-06 MED ORDER — CELECOXIB 200 MG PO CAPS
200.0000 mg | ORAL_CAPSULE | Freq: Every day | ORAL | 1 refills | Status: AC
  Filled 2022-01-06: qty 30, 30d supply, fill #0

## 2022-01-08 ENCOUNTER — Other Ambulatory Visit (HOSPITAL_COMMUNITY): Payer: Self-pay

## 2022-02-08 ENCOUNTER — Other Ambulatory Visit (HOSPITAL_COMMUNITY): Payer: Self-pay

## 2022-02-08 ENCOUNTER — Ambulatory Visit
Admission: RE | Admit: 2022-02-08 | Discharge: 2022-02-08 | Disposition: A | Discharge status: Home / Self Care | Payer: BC Managed Care – PPO | Source: Ambulatory Visit | Attending: Internal Medicine | Admitting: Internal Medicine

## 2022-02-08 DIAGNOSIS — Z1231 Encounter for screening mammogram for malignant neoplasm of breast: Secondary | ICD-10-CM

## 2022-02-16 ENCOUNTER — Other Ambulatory Visit (HOSPITAL_COMMUNITY): Payer: Self-pay

## 2022-05-05 ENCOUNTER — Encounter (INDEPENDENT_AMBULATORY_CARE_PROVIDER_SITE_OTHER): Payer: Self-pay

## 2023-08-17 ENCOUNTER — Other Ambulatory Visit: Payer: Self-pay | Admitting: Internal Medicine

## 2023-08-17 DIAGNOSIS — Z1231 Encounter for screening mammogram for malignant neoplasm of breast: Secondary | ICD-10-CM

## 2023-08-19 ENCOUNTER — Other Ambulatory Visit (HOSPITAL_COMMUNITY): Payer: Self-pay

## 2023-08-19 MED ORDER — METFORMIN HCL 500 MG PO TABS
500.0000 mg | ORAL_TABLET | Freq: Two times a day (BID) | ORAL | 3 refills | Status: DC
  Filled 2023-11-18: qty 180, 90d supply, fill #0

## 2023-08-20 ENCOUNTER — Other Ambulatory Visit (HOSPITAL_COMMUNITY): Payer: Self-pay

## 2023-09-05 ENCOUNTER — Other Ambulatory Visit (HOSPITAL_COMMUNITY): Payer: Self-pay

## 2023-09-05 ENCOUNTER — Other Ambulatory Visit: Payer: Self-pay

## 2023-09-05 MED ORDER — TRAVOPROST (BAK FREE) 0.004 % OP SOLN
1.0000 [drp] | Freq: Every day | OPHTHALMIC | 1 refills | Status: DC
  Filled 2023-09-05 (×2): qty 7.5, 75d supply, fill #0
  Filled 2024-01-08: qty 7.5, 75d supply, fill #1
  Filled 2024-02-01: qty 2.5, 30d supply, fill #2

## 2023-09-08 ENCOUNTER — Other Ambulatory Visit (HOSPITAL_COMMUNITY): Payer: Self-pay

## 2023-09-09 ENCOUNTER — Ambulatory Visit
Admission: RE | Admit: 2023-09-09 | Discharge: 2023-09-09 | Disposition: A | Discharge status: Home / Self Care | Payer: 59 | Source: Ambulatory Visit | Attending: Internal Medicine

## 2023-09-09 ENCOUNTER — Other Ambulatory Visit (HOSPITAL_COMMUNITY): Payer: Self-pay

## 2023-09-09 DIAGNOSIS — Z1231 Encounter for screening mammogram for malignant neoplasm of breast: Secondary | ICD-10-CM

## 2023-09-09 MED ORDER — COLCHICINE 0.6 MG PO TABS
1.2000 mg | ORAL_TABLET | Freq: Every day | ORAL | 0 refills | Status: AC
  Filled 2023-09-09: qty 30, 15d supply, fill #0

## 2023-09-13 ENCOUNTER — Other Ambulatory Visit: Payer: Self-pay | Admitting: Internal Medicine

## 2023-09-13 DIAGNOSIS — R928 Other abnormal and inconclusive findings on diagnostic imaging of breast: Secondary | ICD-10-CM

## 2023-09-27 ENCOUNTER — Other Ambulatory Visit (HOSPITAL_COMMUNITY): Payer: Self-pay

## 2023-09-27 DIAGNOSIS — E1169 Type 2 diabetes mellitus with other specified complication: Secondary | ICD-10-CM | POA: Diagnosis not present

## 2023-09-27 DIAGNOSIS — E559 Vitamin D deficiency, unspecified: Secondary | ICD-10-CM | POA: Diagnosis not present

## 2023-09-27 DIAGNOSIS — B351 Tinea unguium: Secondary | ICD-10-CM | POA: Diagnosis not present

## 2023-09-27 DIAGNOSIS — J45909 Unspecified asthma, uncomplicated: Secondary | ICD-10-CM | POA: Diagnosis not present

## 2023-09-27 DIAGNOSIS — H401131 Primary open-angle glaucoma, bilateral, mild stage: Secondary | ICD-10-CM | POA: Diagnosis not present

## 2023-09-27 DIAGNOSIS — I10 Essential (primary) hypertension: Secondary | ICD-10-CM | POA: Diagnosis not present

## 2023-09-27 DIAGNOSIS — Z8619 Personal history of other infectious and parasitic diseases: Secondary | ICD-10-CM | POA: Diagnosis not present

## 2023-09-27 DIAGNOSIS — Z0000 Encounter for general adult medical examination without abnormal findings: Secondary | ICD-10-CM | POA: Diagnosis not present

## 2023-09-27 DIAGNOSIS — E7800 Pure hypercholesterolemia, unspecified: Secondary | ICD-10-CM | POA: Diagnosis not present

## 2023-09-27 MED ORDER — CHLORTHALIDONE 25 MG PO TABS
25.0000 mg | ORAL_TABLET | Freq: Every morning | ORAL | 1 refills | Status: DC
  Filled 2023-09-27: qty 90, 90d supply, fill #0
  Filled 2023-12-25: qty 90, 90d supply, fill #1

## 2023-09-27 MED ORDER — ALLOPURINOL 100 MG PO TABS
100.0000 mg | ORAL_TABLET | Freq: Every day | ORAL | 1 refills | Status: DC
  Filled 2023-09-27: qty 90, 90d supply, fill #0
  Filled 2024-04-14: qty 90, 90d supply, fill #1

## 2023-09-29 ENCOUNTER — Other Ambulatory Visit (HOSPITAL_COMMUNITY): Payer: Self-pay

## 2023-10-05 ENCOUNTER — Other Ambulatory Visit (HOSPITAL_BASED_OUTPATIENT_CLINIC_OR_DEPARTMENT_OTHER): Payer: Self-pay

## 2023-10-05 ENCOUNTER — Other Ambulatory Visit (HOSPITAL_COMMUNITY): Payer: Self-pay

## 2023-10-05 MED ORDER — VALACYCLOVIR HCL 500 MG PO TABS
500.0000 mg | ORAL_TABLET | Freq: Two times a day (BID) | ORAL | 3 refills | Status: AC
  Filled 2023-10-05: qty 30, 15d supply, fill #0

## 2023-10-05 MED ORDER — CELECOXIB 200 MG PO CAPS
200.0000 mg | ORAL_CAPSULE | Freq: Every day | ORAL | 1 refills | Status: DC
  Filled 2023-10-05: qty 90, 90d supply, fill #0
  Filled 2024-04-14: qty 90, 90d supply, fill #1

## 2023-10-05 MED ORDER — BUDESONIDE-FORMOTEROL FUMARATE 160-4.5 MCG/ACT IN AERO
2.0000 | INHALATION_SPRAY | Freq: Every day | RESPIRATORY_TRACT | 1 refills | Status: AC
  Filled 2023-10-05: qty 30.6, 90d supply, fill #0

## 2023-10-20 ENCOUNTER — Ambulatory Visit
Admission: RE | Admit: 2023-10-20 | Discharge: 2023-10-20 | Disposition: A | Discharge status: Home / Self Care | Payer: 59 | Source: Ambulatory Visit | Attending: Internal Medicine | Admitting: Internal Medicine

## 2023-10-20 DIAGNOSIS — R928 Other abnormal and inconclusive findings on diagnostic imaging of breast: Secondary | ICD-10-CM

## 2023-10-20 DIAGNOSIS — R92321 Mammographic fibroglandular density, right breast: Secondary | ICD-10-CM | POA: Diagnosis not present

## 2023-10-21 ENCOUNTER — Other Ambulatory Visit: Payer: Self-pay | Admitting: Internal Medicine

## 2023-10-21 DIAGNOSIS — N6489 Other specified disorders of breast: Secondary | ICD-10-CM

## 2023-11-18 ENCOUNTER — Other Ambulatory Visit: Payer: Self-pay

## 2023-11-18 ENCOUNTER — Other Ambulatory Visit (HOSPITAL_COMMUNITY): Payer: Self-pay

## 2023-12-06 ENCOUNTER — Encounter: Payer: Self-pay | Admitting: Internal Medicine

## 2024-01-09 ENCOUNTER — Other Ambulatory Visit (HOSPITAL_COMMUNITY): Payer: Self-pay

## 2024-01-30 ENCOUNTER — Other Ambulatory Visit (HOSPITAL_COMMUNITY): Payer: Self-pay

## 2024-01-30 MED ORDER — ATORVASTATIN CALCIUM 10 MG PO TABS
10.0000 mg | ORAL_TABLET | Freq: Every day | ORAL | 1 refills | Status: AC
  Filled 2024-01-30: qty 90, 90d supply, fill #0
  Filled 2024-08-14: qty 90, 90d supply, fill #1

## 2024-02-01 ENCOUNTER — Other Ambulatory Visit (HOSPITAL_COMMUNITY): Payer: Self-pay

## 2024-02-01 ENCOUNTER — Other Ambulatory Visit: Payer: Self-pay

## 2024-02-02 ENCOUNTER — Other Ambulatory Visit (HOSPITAL_COMMUNITY): Payer: Self-pay

## 2024-02-03 ENCOUNTER — Other Ambulatory Visit (HOSPITAL_COMMUNITY): Payer: Self-pay

## 2024-02-03 MED ORDER — TRAVOPROST (BAK FREE) 0.004 % OP SOLN
1.0000 [drp] | Freq: Every day | OPHTHALMIC | 1 refills | Status: AC
  Filled 2024-03-18: qty 7.5, 75d supply, fill #0
  Filled 2024-07-04: qty 7.5, 75d supply, fill #1

## 2024-03-05 DIAGNOSIS — H401131 Primary open-angle glaucoma, bilateral, mild stage: Secondary | ICD-10-CM | POA: Diagnosis not present

## 2024-03-19 ENCOUNTER — Other Ambulatory Visit (HOSPITAL_COMMUNITY): Payer: Self-pay

## 2024-03-28 DIAGNOSIS — J45909 Unspecified asthma, uncomplicated: Secondary | ICD-10-CM | POA: Diagnosis not present

## 2024-03-28 DIAGNOSIS — I10 Essential (primary) hypertension: Secondary | ICD-10-CM | POA: Diagnosis not present

## 2024-03-28 DIAGNOSIS — Z8619 Personal history of other infectious and parasitic diseases: Secondary | ICD-10-CM | POA: Diagnosis not present

## 2024-03-28 DIAGNOSIS — Z6839 Body mass index (BMI) 39.0-39.9, adult: Secondary | ICD-10-CM | POA: Diagnosis not present

## 2024-03-28 DIAGNOSIS — E7800 Pure hypercholesterolemia, unspecified: Secondary | ICD-10-CM | POA: Diagnosis not present

## 2024-03-28 DIAGNOSIS — M1A0790 Idiopathic chronic gout, unspecified ankle and foot, without tophus (tophi): Secondary | ICD-10-CM | POA: Diagnosis not present

## 2024-03-28 DIAGNOSIS — E1169 Type 2 diabetes mellitus with other specified complication: Secondary | ICD-10-CM | POA: Diagnosis not present

## 2024-03-28 DIAGNOSIS — Z23 Encounter for immunization: Secondary | ICD-10-CM | POA: Diagnosis not present

## 2024-03-28 DIAGNOSIS — L259 Unspecified contact dermatitis, unspecified cause: Secondary | ICD-10-CM | POA: Diagnosis not present

## 2024-03-29 ENCOUNTER — Other Ambulatory Visit (HOSPITAL_COMMUNITY): Payer: Self-pay

## 2024-03-29 MED ORDER — OZEMPIC (0.25 OR 0.5 MG/DOSE) 2 MG/3ML ~~LOC~~ SOPN
0.2500 mg | PEN_INJECTOR | SUBCUTANEOUS | 1 refills | Status: DC
  Filled 2024-03-29: qty 3, 28d supply, fill #0
  Filled 2024-05-26: qty 3, 28d supply, fill #1

## 2024-04-02 ENCOUNTER — Other Ambulatory Visit (HOSPITAL_COMMUNITY): Payer: Self-pay

## 2024-04-13 ENCOUNTER — Other Ambulatory Visit: Payer: Self-pay | Admitting: Medical Genetics

## 2024-04-13 ENCOUNTER — Other Ambulatory Visit (HOSPITAL_COMMUNITY): Payer: Self-pay

## 2024-04-14 ENCOUNTER — Other Ambulatory Visit (HOSPITAL_COMMUNITY): Payer: Self-pay

## 2024-04-16 ENCOUNTER — Other Ambulatory Visit (HOSPITAL_COMMUNITY): Payer: Self-pay

## 2024-04-16 MED ORDER — CHLORTHALIDONE 25 MG PO TABS
25.0000 mg | ORAL_TABLET | Freq: Every morning | ORAL | 3 refills | Status: AC
  Filled 2024-04-16: qty 90, 90d supply, fill #0
  Filled 2024-08-22: qty 90, 90d supply, fill #1

## 2024-04-18 ENCOUNTER — Other Ambulatory Visit (HOSPITAL_COMMUNITY)
Admission: RE | Admit: 2024-04-18 | Discharge: 2024-04-18 | Disposition: A | Discharge status: Home / Self Care | Payer: Self-pay | Source: Ambulatory Visit | Attending: Medical Genetics | Admitting: Medical Genetics

## 2024-04-19 ENCOUNTER — Ambulatory Visit
Admission: RE | Admit: 2024-04-19 | Discharge: 2024-04-19 | Disposition: A | Discharge status: Home / Self Care | Source: Ambulatory Visit | Attending: Internal Medicine | Admitting: Internal Medicine

## 2024-04-19 ENCOUNTER — Other Ambulatory Visit (HOSPITAL_COMMUNITY)

## 2024-04-19 DIAGNOSIS — N6489 Other specified disorders of breast: Secondary | ICD-10-CM

## 2024-04-19 DIAGNOSIS — R928 Other abnormal and inconclusive findings on diagnostic imaging of breast: Secondary | ICD-10-CM | POA: Diagnosis not present

## 2024-04-26 ENCOUNTER — Other Ambulatory Visit (HOSPITAL_BASED_OUTPATIENT_CLINIC_OR_DEPARTMENT_OTHER): Payer: Self-pay

## 2024-04-26 ENCOUNTER — Other Ambulatory Visit (HOSPITAL_COMMUNITY): Payer: Self-pay

## 2024-04-26 MED ORDER — AMOXICILLIN 500 MG PO CAPS
500.0000 mg | ORAL_CAPSULE | Freq: Three times a day (TID) | ORAL | 0 refills | Status: AC
  Filled 2024-04-26: qty 21, 7d supply, fill #0

## 2024-04-27 LAB — GENECONNECT MOLECULAR SCREEN: Genetic Analysis Overall Interpretation: NEGATIVE

## 2024-05-31 ENCOUNTER — Other Ambulatory Visit (HOSPITAL_COMMUNITY): Payer: Self-pay

## 2024-05-31 DIAGNOSIS — E1169 Type 2 diabetes mellitus with other specified complication: Secondary | ICD-10-CM | POA: Diagnosis not present

## 2024-05-31 DIAGNOSIS — J329 Chronic sinusitis, unspecified: Secondary | ICD-10-CM | POA: Diagnosis not present

## 2024-05-31 DIAGNOSIS — B9689 Other specified bacterial agents as the cause of diseases classified elsewhere: Secondary | ICD-10-CM | POA: Diagnosis not present

## 2024-05-31 MED ORDER — AMOXICILLIN-POT CLAVULANATE 875-125 MG PO TABS
1.0000 | ORAL_TABLET | Freq: Two times a day (BID) | ORAL | 0 refills | Status: AC
  Filled 2024-05-31: qty 20, 10d supply, fill #0

## 2024-06-26 ENCOUNTER — Other Ambulatory Visit (HOSPITAL_COMMUNITY): Payer: Self-pay

## 2024-06-26 ENCOUNTER — Other Ambulatory Visit: Payer: Self-pay

## 2024-07-04 ENCOUNTER — Other Ambulatory Visit (HOSPITAL_COMMUNITY): Payer: Self-pay

## 2024-07-04 ENCOUNTER — Other Ambulatory Visit: Payer: Self-pay

## 2024-07-04 MED ORDER — VALACYCLOVIR HCL 500 MG PO TABS
500.0000 mg | ORAL_TABLET | Freq: Two times a day (BID) | ORAL | 3 refills | Status: AC
  Filled 2024-07-04: qty 30, 15d supply, fill #0

## 2024-07-06 ENCOUNTER — Other Ambulatory Visit (HOSPITAL_COMMUNITY): Payer: Self-pay | Admitting: Nurse Practitioner

## 2024-07-06 ENCOUNTER — Ambulatory Visit (HOSPITAL_COMMUNITY)
Admission: RE | Admit: 2024-07-06 | Discharge: 2024-07-06 | Disposition: A | Discharge status: Home / Self Care | Payer: Self-pay | Source: Ambulatory Visit | Attending: Nurse Practitioner | Admitting: Nurse Practitioner

## 2024-07-06 DIAGNOSIS — M19041 Primary osteoarthritis, right hand: Secondary | ICD-10-CM | POA: Diagnosis not present

## 2024-07-06 DIAGNOSIS — S60211A Contusion of right wrist, initial encounter: Secondary | ICD-10-CM

## 2024-07-06 DIAGNOSIS — S638X1A Sprain of other part of right wrist and hand, initial encounter: Secondary | ICD-10-CM | POA: Diagnosis present

## 2024-07-06 DIAGNOSIS — M19031 Primary osteoarthritis, right wrist: Secondary | ICD-10-CM | POA: Diagnosis not present

## 2024-07-06 DIAGNOSIS — M25531 Pain in right wrist: Secondary | ICD-10-CM | POA: Diagnosis not present

## 2024-07-20 ENCOUNTER — Other Ambulatory Visit (HOSPITAL_COMMUNITY): Payer: Self-pay | Admitting: Orthopedic Surgery

## 2024-07-20 DIAGNOSIS — M25531 Pain in right wrist: Secondary | ICD-10-CM

## 2024-07-24 ENCOUNTER — Ambulatory Visit (HOSPITAL_COMMUNITY)
Admission: RE | Admit: 2024-07-24 | Discharge: 2024-07-24 | Disposition: A | Discharge status: Home / Self Care | Source: Ambulatory Visit | Attending: Orthopedic Surgery | Admitting: Orthopedic Surgery

## 2024-07-24 DIAGNOSIS — M25531 Pain in right wrist: Secondary | ICD-10-CM | POA: Diagnosis present

## 2024-07-26 DIAGNOSIS — H401131 Primary open-angle glaucoma, bilateral, mild stage: Secondary | ICD-10-CM | POA: Diagnosis not present

## 2024-08-09 ENCOUNTER — Other Ambulatory Visit (HOSPITAL_COMMUNITY): Payer: Self-pay

## 2024-08-09 ENCOUNTER — Other Ambulatory Visit: Payer: Self-pay

## 2024-08-09 MED ORDER — OZEMPIC (0.25 OR 0.5 MG/DOSE) 2 MG/3ML ~~LOC~~ SOPN
0.2500 mg | PEN_INJECTOR | SUBCUTANEOUS | 1 refills | Status: AC
  Filled 2024-08-09: qty 3, 56d supply, fill #0

## 2024-08-16 ENCOUNTER — Other Ambulatory Visit (HOSPITAL_COMMUNITY): Payer: Self-pay

## 2024-08-16 MED ORDER — BISACODYL 5 MG PO TBEC
DELAYED_RELEASE_TABLET | ORAL | 0 refills | Status: AC
  Filled 2024-08-16: qty 4, 1d supply, fill #0

## 2024-08-16 MED ORDER — PEG-3350/ELECTROLYTES 236 G PO SOLR
4000.0000 mL | ORAL | 0 refills | Status: AC
  Filled 2024-08-16: qty 4000, 1d supply, fill #0

## 2024-08-22 ENCOUNTER — Other Ambulatory Visit (HOSPITAL_COMMUNITY): Payer: Self-pay

## 2024-08-22 ENCOUNTER — Other Ambulatory Visit: Payer: Self-pay

## 2024-08-22 MED ORDER — METFORMIN HCL 500 MG PO TABS
500.0000 mg | ORAL_TABLET | Freq: Two times a day (BID) | ORAL | 3 refills | Status: AC
  Filled 2024-08-22: qty 180, 90d supply, fill #0

## 2024-08-22 MED ORDER — CELECOXIB 200 MG PO CAPS
200.0000 mg | ORAL_CAPSULE | Freq: Every day | ORAL | 1 refills | Status: AC
  Filled 2024-08-22: qty 90, 90d supply, fill #0

## 2024-08-22 MED ORDER — ALLOPURINOL 100 MG PO TABS
100.0000 mg | ORAL_TABLET | Freq: Every day | ORAL | 1 refills | Status: AC
  Filled 2024-08-22: qty 90, 90d supply, fill #0

## 2024-08-31 DIAGNOSIS — Z09 Encounter for follow-up examination after completed treatment for conditions other than malignant neoplasm: Secondary | ICD-10-CM | POA: Diagnosis not present

## 2024-08-31 DIAGNOSIS — K5730 Diverticulosis of large intestine without perforation or abscess without bleeding: Secondary | ICD-10-CM | POA: Diagnosis not present

## 2024-08-31 DIAGNOSIS — Z860100 Personal history of colon polyps, unspecified: Secondary | ICD-10-CM | POA: Diagnosis not present

## 2024-10-02 ENCOUNTER — Encounter (HOSPITAL_COMMUNITY): Payer: Self-pay | Admitting: Pharmacist

## 2024-10-02 ENCOUNTER — Other Ambulatory Visit (HOSPITAL_COMMUNITY): Payer: Self-pay

## 2024-10-02 MED ORDER — RYBELSUS 3 MG PO TABS
3.0000 mg | ORAL_TABLET | Freq: Every day | ORAL | 1 refills | Status: AC
  Filled 2024-10-02: qty 30, 30d supply, fill #0
  Filled 2024-10-28: qty 30, 30d supply, fill #1

## 2024-10-03 ENCOUNTER — Other Ambulatory Visit (HOSPITAL_COMMUNITY): Payer: Self-pay

## 2024-10-17 ENCOUNTER — Other Ambulatory Visit (HOSPITAL_COMMUNITY): Payer: Self-pay

## 2024-10-18 ENCOUNTER — Other Ambulatory Visit: Payer: Self-pay

## 2024-10-18 ENCOUNTER — Other Ambulatory Visit (HOSPITAL_COMMUNITY): Payer: Self-pay

## 2024-10-18 MED ORDER — TRAVOPROST (BAK FREE) 0.004 % OP SOLN
1.0000 [drp] | Freq: Every evening | OPHTHALMIC | 1 refills | Status: AC
  Filled 2024-10-18: qty 2.5, 30d supply, fill #0

## 2024-10-19 ENCOUNTER — Other Ambulatory Visit (HOSPITAL_COMMUNITY): Payer: Self-pay

## 2024-10-28 ENCOUNTER — Other Ambulatory Visit (HOSPITAL_COMMUNITY): Payer: Self-pay
